# Patient Record
Sex: Female | Born: 1945
Health system: Southern US, Community
[De-identification: ages and names within clinical notes are randomized; demographics above are authoritative.]

## PROBLEM LIST (undated history)

## (undated) DIAGNOSIS — I10 Essential (primary) hypertension: Secondary | ICD-10-CM

## (undated) DIAGNOSIS — E039 Hypothyroidism, unspecified: Secondary | ICD-10-CM

## (undated) DIAGNOSIS — E785 Hyperlipidemia, unspecified: Secondary | ICD-10-CM

## (undated) HISTORY — PX: UPPER GI ENDOSCOPY: SHX6162

## (undated) HISTORY — PX: TONSILLECTOMY: SUR1361

## (undated) HISTORY — PX: CYSTOCELE REPAIR: SHX163

## (undated) HISTORY — PX: RECTOCELE REPAIR: SHX761

## (undated) HISTORY — PX: CHOLECYSTECTOMY: SHX55

## (undated) HISTORY — PX: COLONOSCOPY: SHX174

## (undated) HISTORY — PX: ABDOMINAL HYSTERECTOMY: SHX81

---

## 1998-09-17 ENCOUNTER — Ambulatory Visit (HOSPITAL_COMMUNITY): Admission: RE | Admit: 1998-09-17 | Discharge: 1998-09-17 | Payer: Self-pay | Admitting: Orthopedic Surgery

## 2001-01-05 ENCOUNTER — Inpatient Hospital Stay (HOSPITAL_COMMUNITY): Admission: EM | Admit: 2001-01-05 | Discharge: 2001-01-07 | Payer: Self-pay | Admitting: Emergency Medicine

## 2001-01-05 ENCOUNTER — Encounter: Payer: Self-pay | Admitting: Emergency Medicine

## 2002-05-23 ENCOUNTER — Other Ambulatory Visit: Admission: RE | Admit: 2002-05-23 | Discharge: 2002-05-23 | Payer: Self-pay | Admitting: Family Medicine

## 2004-09-02 ENCOUNTER — Ambulatory Visit: Payer: Self-pay | Admitting: Family Medicine

## 2006-10-20 ENCOUNTER — Other Ambulatory Visit: Admission: RE | Admit: 2006-10-20 | Discharge: 2006-10-20 | Payer: Self-pay | Admitting: Family Medicine

## 2008-08-16 ENCOUNTER — Inpatient Hospital Stay (HOSPITAL_COMMUNITY): Admission: EM | Admit: 2008-08-16 | Discharge: 2008-08-17 | Payer: Self-pay | Admitting: Emergency Medicine

## 2008-08-16 ENCOUNTER — Ambulatory Visit: Payer: Self-pay | Admitting: Internal Medicine

## 2008-08-19 ENCOUNTER — Ambulatory Visit: Payer: Self-pay

## 2008-08-29 ENCOUNTER — Ambulatory Visit: Payer: Self-pay | Admitting: Cardiology

## 2009-07-21 ENCOUNTER — Ambulatory Visit: Payer: Self-pay | Admitting: Vascular Surgery

## 2009-09-16 ENCOUNTER — Other Ambulatory Visit: Admission: RE | Admit: 2009-09-16 | Discharge: 2009-09-16 | Payer: Self-pay | Admitting: Family Medicine

## 2009-10-16 IMAGING — CR DG CHEST 1V PORT
1 series · 1 of 1 positions shown · non-contrast
Comparison: Report dated 01/05/2001.

CLINICAL DATA: Chest pain.

PORTABLE CHEST - 1 VIEW

[view not recorded]
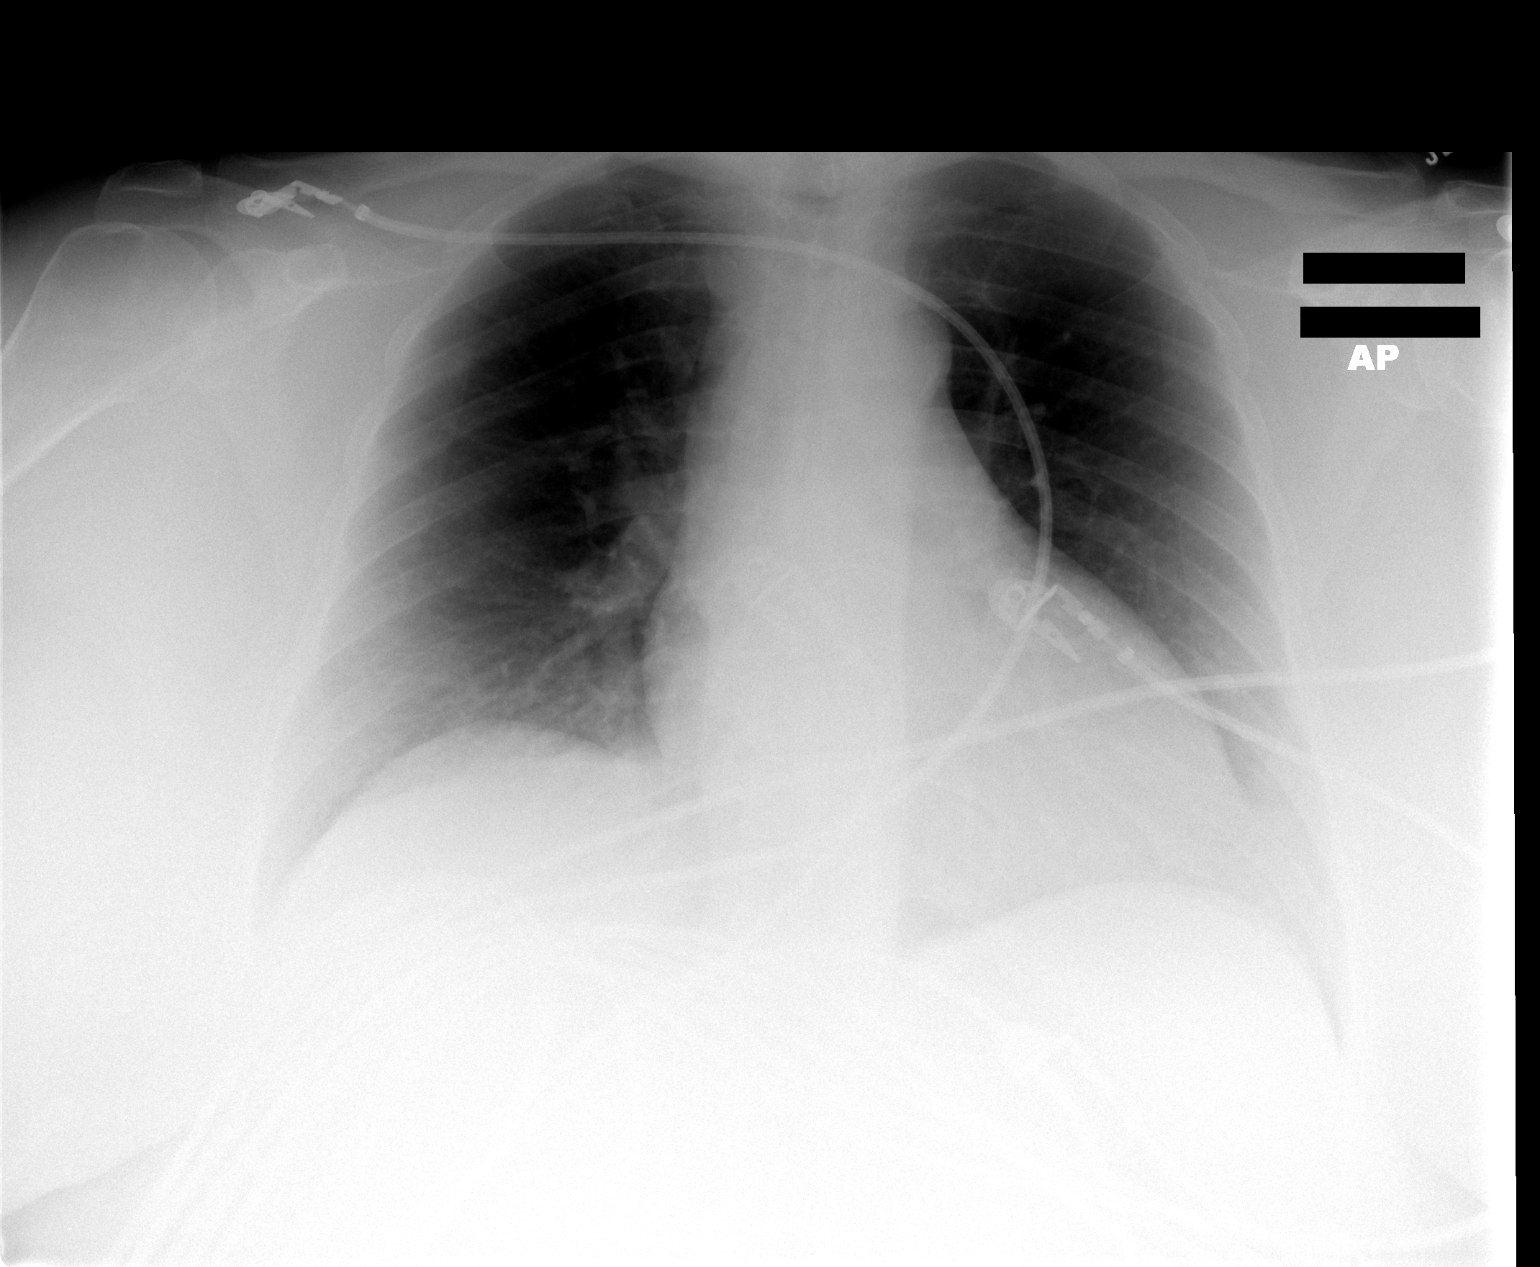

[1 of 1 positions shown; findings below may reference images not displayed]

FINDINGS: Borderline enlarged cardiac silhouette.  Clear lungs with
normal vascularity.  Unremarkable bones.  The previously described
density overlying the medial right clavicle is not currently
visualized.  The patient's chin is overlapping portions of both
clavicles.
IMPRESSION: Borderline cardiomegaly.  No acute abnormality.

## 2009-10-21 ENCOUNTER — Ambulatory Visit: Payer: Self-pay | Admitting: Vascular Surgery

## 2009-11-24 ENCOUNTER — Ambulatory Visit: Payer: Self-pay | Admitting: Vascular Surgery

## 2009-12-02 ENCOUNTER — Ambulatory Visit: Payer: Self-pay | Admitting: Vascular Surgery

## 2009-12-04 ENCOUNTER — Ambulatory Visit: Payer: Self-pay | Admitting: Vascular Surgery

## 2010-01-08 ENCOUNTER — Ambulatory Visit: Payer: Self-pay | Admitting: Vascular Surgery

## 2011-03-23 NOTE — H&P (Signed)
NAMELELER, BRION NO.:  [id number]   MEDICAL RECORD NO.:  [id number]          PATIENT TYPE:  INP   LOCATION:  2011                         FACILITY:  [facility]   PHYSICIAN:  Gordy Savers, MDDATE OF BIRTH:  12/13/1945   DATE OF ADMISSION:  08/16/2008  DATE OF DISCHARGE:                              HISTORY & PHYSICAL   CHIEF COMPLAINT:  Chest pain.   HISTORY OF PRESENT ILLNESS:  The patient is a 65 year old female who was  admitted to the hospital in February 2002, for evaluation of chest pain  at that time and acute myocardial infarction was excluded and the  patient had a followup outpatient Cardiolite stress test that was  negative.  For the past 6 months, she has had frequent back and chest  pain.  This has been intermittent over the past 6 months and usually  precipitated by exertion and quickly relieved by rest within 5 minutes.  The character of the pain was unchanged until the past 3 days.  Over the  past 3 days, she describes a more severe squeezing interscapular pain  that radiates to the anterior chest.  This has become much more severe  and the patient scores this 7/10 grade of pain.  Pain usually is  aggravated by activities but now occasionally occurs at rest.  Pain at  times last hours before relief associated symptoms include shortness of  breath, diaphoresis, mild dizziness, and nausea.  He also describes some  tingling involving both arms, the left greater than the right.  Due to  the increasing frequency and severity of pain, the patient was seen by  her primary care Lanita Stammen on the day of admission.  In the office  setting, the patient had relief of her chest discomfort promptly with  nitroglycerin.  The patient was referred to the ED for evaluation where  EKG revealed a normal sinus rhythm and no acute changes.  Two sets of  cardiac enzymes have been negative.  The patient is now admitted for  further evaluation and treatment of her  chest pain syndrome.   PAST MEDICAL HISTORY:  The patient has a long history of hypertension  and hypothyroidism.  Additionally, she has obesity and a history of  gastroesophageal flexed disease.  She has DJD.  As mentioned, she was  last admitted to hospital to exclude an acute MI in February 2002.   Her present medical regimen includes:  1. Aspirin 162 mg daily.  2. Atenolol 50 mg daily.  3. Quinapril 40 mg daily.  4. Hydrochlorothiazide 25 mg daily.  5. Synthroid 0.05 mg daily.  6. Fish oil supplements.   Prior procedures have included a hysterectomy and probably an incidental  appendectomy.  She is a gravida 3, para 3, abortus 0 with a prior C-  section.   FAMILY HISTORY:  The patient's mother died of an acute MI at age 43.  Father died with complications of lung cancer.  Two brothers, two  sisters, one brother has a history of diabetes and congestive heart  failure.  Another brother with Crohn disease.  One sister died of a CNS  neoplasm at age 85, and one sister with chronic kidney disease.   SOCIAL HISTORY:  Married.  Nonsmoker since age 27.   Examination revealed a well-developed obese, white female, pleasant,  alert in no distress.  Nasal oxygen in place.  Head and neck revealed  normal pupillary responses.  Conjunctiva clear.  ENT unremarkable.  Neck, no adenopathy, bruits, or neck vein distention.  Chest was clear.  Cardiovascular exam normal S1 and S2.  No murmurs or gallops.  No chest  wall tenderness.  Abdomen is obese, soft, and nontender.  No  organomegaly.  Extremities are negative.  Peripheral pulses intact.   IMPRESSION:  Chest pain syndrome, rule out acute coronary insufficiency.   ADDITIONAL DIAGNOSES:  1. Hypertension.  2. Obesity.  3. Hypothyroidism.  4. History of gastroesophageal reflux disease.   DISPOSITION:  The patient will be admitted to a telemetry setting, rule  out the MI.  Protocol will be instituted.  The patient will have another  set  of cardiac enzymes and a followup EKG in the morning.  She will be  treated with beta-blockers and maintained on her preadmission ACE  inhibitor.  Aspirin will be continued and Plavix added to her regimen.  She will be placed on Lovenox per pharmacy protocol.  The patient will  have a Cardiology consultation in the morning.      Gordy Savers, MD  Electronically Signed     PFK/MEDQ  D:  08/16/2008  T:  08/17/2008  Job:  [id number]

## 2011-03-23 NOTE — Consult Note (Signed)
NAMEDOMINO, HOLTEN NO.:  [id number]   MEDICAL RECORD NO.:  [id number]          PATIENT TYPE:  INP   LOCATION:  2011                         FACILITY:  [facility]   PHYSICIAN:  Hillis Range, MD       DATE OF BIRTH:  Nov 06, 1946   DATE OF CONSULTATION:  DATE OF DISCHARGE:  08/17/2008                                 CONSULTATION   CONSULTING PHYSICIAN:  Kela Millin, MD.   REASON FOR CONSULTATION:  Chest pain.   INTRODUCTION:  Ms. Kamm is a pleasant 65 year old female with a history  of hypertension, hyperlipidemia, and obesity who presents for further  evaluation and management of chest discomfort.  The patient reports that  over the past 6-8 months, she has had a squeezing and burning  sensation within in her back, between her shoulder blades, which often  radiates into in front of her chest.  These episodes typically occur  several times per month at rest, lasting 4-5 minutes.  She reports that  over the past week, she has had increasing frequency and duration of  these episodes.  Her pain is slightly different over the past couple of  days and has radiated as well into her left lower breast.  She reports  occasional associated diaphoresis as well as a tingling sensation within  the tips of her fingers on both hands.  The patient reports chronic and  mild dyspnea with ambulation more than 1 flight of steps or with  moderate activities.  She denies recent nausea, vomiting, palpitations,  near syncope, or syncope.  The patient reports long-standing  difficulties with gastroesophageal reflux.  She has previously tried H2  blockers; however, she has had stopped these due to diarrhea.  She has  also tried Prilosec and Nexium, but was unable to tolerate these  medications.  She reports that her chest discomfort typically occurs at  rest, but does occasionally occur with activities.  She has not noticed  an association with foods.  She presented to the emergency  department  last evening and reports that her chest pain gradually resolved  overnight.  She has had no further chest discomfort this morning.  Presently, she is ambulatory and without complaint.   PAST MEDICAL HISTORY:  1. Hypertension.  2. Morbid obesity.  3. Hypothyroidism.  4. Gastroesophageal reflux disease.  5. Degenerative joint disease.  6. Hyperlipidemia.  7. Status post cardiac Myoview in 2002 by Dr. Myrtis Ser, which was normal.  8. Status post cholecystectomy in 1987.  9. Status post total abdominal hysterectomy.   MEDICATIONS AT HOME:  1. Aspirin 162 mg daily.  2. Atenolol 50 mg daily.  3. Quinapril 40 mg daily.  4. Hydrochlorothiazide 25 mg daily.  5. Synthroid 50 mcg daily.  6. Fish oil b.i.d.   ALLERGIES:  PHENOBARBITAL causes rash.  The patient reports intolerance  to H2 BLOCKERS as noted above.   SOCIAL HISTORY:  The patient lives in [place] with her spouse.  She  is retired.  She denies tobacco, alcohol, or drug use.   FAMILY HISTORY:  The patient's mother had  coronary disease in her 24s.  She also has a family history of diabetes and congestive heart failure.   REVIEW OF SYSTEMS:  All systems are reviewed and negative except as  outlined in the HPI above.   PHYSICAL EXAMINATION:  VITAL SIGNS:  Blood pressure 129/59, heart rate  56, respirations 18, and sats 95% on room air.  GENERAL:  The patient is an obese female in no acute distress.  She is  alert and oriented x3.  HEENT:  Normocephalic and atraumatic.  Sclerae clear.  Conjunctivae  pink.  Oropharynx clear.  NECK:  Supple.  No JVD, lymphadenopathy, or bruits.  LUNGS:  Clear to auscultation bilaterally.  HEART:  Regular rate and rhythm.  No murmurs, rubs, or gallops.  GI:  Soft, nontender, and nondistended.  Positive bowel sounds.  EXTREMITIES:  No clubbing, cyanosis, or edema.  No Homans or cords.  SKIN:  No ecchymosis or laceration.  MUSCULOSKELETAL:  No deformity or atrophy.  PSYCHIATRIC:   Euthymic mood.  Full affect.  NEUROLOGIC:  Strength and sensation are intact.   LABORATORY DATA:  Total cholesterol 201, LDL 143, and HDL 23.  Hematocrit 39 and white blood cell count 8.  Troponin, CK, and CK-MB are  all negative.   Chest x-ray, no acute airspace disease.   EKG, sinus bradycardia, no ischemic ST/T-wave changes, otherwise normal.   IMPRESSION:  Ms. Budreau is a very pleasant 65 year old female with a  history of hypertension, hyperlipidemia, and obesity who now presents  with chest discomfort.  The patient's chest pain has been present  intermittently for more than 6 months and appears to be atypical.  She  has noticed some increase in her pain over the past few days, now it has  presently resolved.  Her EKG and cardiac markers are negative.  The  patient likely has chest discomfort related to chronic untreated  gastroesophageal reflux disease.  She does have risk factors for  coronary artery disease as described above.   PLAN:  I think that the most prudent plan would be to obtain a GXT  Myoview to further evaluate for ischemia.  This could be safely  performed as an outpatient in the next few days.  I would recommend that  the patient be discharged on her home medication regimen with addition  of sublingual nitroglycerin.  I have offered simvastatin 20 mg daily for  hyperlipidemia; however, the patient presently wishes to defer medical  therapy and continue to work on weight loss and further lifestyle  modifications.  She is aware that should she have any further chest  discomfort,  increasing shortness of breath, or other concerns that she should return  to the hospital for further inpatient evaluation.  She should also  follow up with Dr. Willa Rough as an outpatient for further cardiac  risk stratification after her Myoview has been completed.      Hillis Range, MD  Electronically Signed     JA/MEDQ  D:  08/17/2008  T:  08/17/2008  Job:  [id number]   cc:    Holley Bouche, M.D.  Luis Abed, MD, [facility]

## 2011-03-23 NOTE — Assessment & Plan Note (Signed)
OFFICE VISIT   ELLENI, MOZINGO  DOB:  [birth year]                                       10/21/2009  XBJYN#:[id number]   This patient returns for follow-up, having treated her venous  insufficiency with elastic compression stockings (long leg, 20 mm - 30  mm) as well as ibuprofen and elevation.  She has done this for the past  3 months with no improvement in her symptomatology.  She continues to  have aching, throbbing, burning discomfort in the left thigh and calf  which is in the same location as the bulging varicosities, secondary to  her reflux in the left great saphenous system.  She also has some reflux  in her deep system.   On examination, she continues to have these bulging varicosities with 1+  edema distally in a pattern of reticular and spider veins in the mid  lower leg medially over the great saphenous system as well.   Blood pressure 148/75, heart rate 66, respirations 14.   This lady does have gross reflux due to valvular incompetence in the  left great saphenous system.  I think we should proceed with laser  ablation of the left great saphenous vein with multiple stab  phlebectomies to be followed by 1 course of sclerotherapy to treat her  severe venous insufficiency in the left leg.  We will proceed to perform  that in the near future to relieve her symptomatology.   Quita Skye Hart [place], M.D.  Electronically Signed   JDL/MEDQ  D:  10/21/2009  T:  10/22/2009  Job:  [id number]

## 2011-03-23 NOTE — Assessment & Plan Note (Signed)
OFFICE VISIT   IVIE, SAVITT  DOB:  Aug 14, 1946                                       12/02/2009  JXBJY#:[id number]   The patient returns 1 week post-laser ablation of the left greater  saphenous vein with multiple stab phlebectomies for painful varicosities  in the left greater saphenous system.  She has had some mild discomfort  along the course of greater saphenous vein in the groin and mid-thigh  area as one would expect.  She has had some bruising distally in the  thigh.  She has had no tenderness associated with stab phlebectomies and  no distal edema.  On physical exam, she has no evidence of any residual  bulging varicosities.  She does have extensive reticular veins.  There  is no distal edema.  There is mild tenderness along the course of the  greater saphenous vein.  Venous duplex exam today reveals no evidence of  deep venous obstruction in the left leg and total ablation of the left  greater saphenous vein from the saphenofemoral junction to the knee.  I  reassured her regarding these findings and she will have sclerotherapy  performed in the near future.     Quita Skye Hart [place], M.D.  Electronically Signed   JDL/MEDQ  D:  12/02/2009  T:  12/03/2009  Job:  [id number]

## 2011-03-23 NOTE — Consult Note (Signed)
NEW PATIENT CONSULTATION   Tammie Hammond, Tammie Hammond  DOB:  1946-01-09                                       07/21/2009  JXBJY#:[id number]   The patient is a 65 year old female referred by Dr. Holley Bouche for  painful varicosities.  This patient has had varicose veins in both lower  extremities for the last 20-25 years and they have become increasingly  symptomatic particularly in the left leg over the last year or two.  She  now experiences a heavy aching throbbing discomfort in the left calf and  thigh, which worsens the more she is on her feet.  This sometimes will  also bother her at night.  She has swelling in the left ankle but no  history of deep vein thrombosis, thrombophlebitis, bleeding, stasis  ulcers, or darkening of the skin.  She has not worn elastic compression  stockings but does state that elevation of her legs helps somewhat.  She  takes occasional ibuprofen, which does not relieve her pain.  She does  state that the symptoms are affecting her daily life with inability to  relieve the aching and throbbing discomfort as long as she is on her  feet.   PAST MEDICAL HISTORY:  1. Hypertension.  2. History of a torn left medial meniscus.  3. Negative for diabetes, hyperlipidemia, coronary artery disease,      COPD, or stroke.   PREVIOUS SURGERY:  1. Cholecystectomy.  2. Hysterectomy.  3. Cesarean section.   FAMILY HISTORY:  Positive for diabetes, coronary artery disease in her  mother.  Negative for stroke.   SOCIAL HISTORY:  She is married and has 3 children, is retired.  She  quit smoking 30 years ago.  Does not use alcohol.   REVIEW OF SYSTEMS:  Positive for dyspnea on exertion, but no chest pain.  No weight loss or anorexia.  Denies any pulmonary symptoms.  Does have  reflux esophagitis, discomfort in her lower extremities with walking,  but limited more by dyspnea.  Diffuse joint pain.  Otherwise, all  systems negative.   ALLERGIES:   Phenobarbital.   MEDICATIONS:  Please see health history exam.   PHYSICAL EXAM:  Blood pressure 166/90, heart rate 64, respirations 14.  Generally, she is an obese female who is in no apparent distress.  She  is alert and oriented x3.  Neck is supple.  3+ carotid pulses palpable.  No bruits are audible.  Neurologic exam is normal.  No palpable  adenopathy of the neck.  Chest clear to auscultation.  Cardiovascular  exam is a regular rhythm with no murmurs.  Abdomen is soft and nontender  with no masses.  She has 3+ femoral, popliteal, and dorsalis pedis  pulses bilaterally.  There is mild edema in the left ankle at 1+.  None  on the right.  The left leg has varicosities in the great saphenous  system beginning in the medial thigh and extending down into the medial  calf with also a large patch of reticular and spider veins medially.  No  ulcers are noted.  The right leg has extensive spider and reticular  veins in the lateral aspect of the calf with a few varicosities in the  lateral thigh.   Venous duplex exam today reveals a left greater saphenous vein to have  diffuse reflux from the knee to  the saphenofemoral junction with mild  reflux in the deep system bilaterally.  Right leg has a segment of vein  laterally in the thigh, which has reflux but does not communicate with  the saphenofemoral junction.  Both small saphenous veins are normal.   I think she does have painful varicosities in the left leg caused by  great saphenous reflux, which are affecting her daily living.  Will  treat her with long-leg elastic compression stockings (20 mm to 30 mm  gradient), as well as elevation as much as possible, and ibuprofen  daily.  Will see her back in 3 months and if she has had no relief in  her symptoms, I would recommend laser ablation of the left greater  saphenous vein with multiple stab phlebectomies to be followed by 1  course of sclerotherapy in the left leg.   Quita Skye Hart [place],  M.D.  Electronically Signed   JDL/MEDQ  D:  07/21/2009  T:  07/21/2009  Job:  [id number]   cc:   Holley Bouche, M.D.

## 2011-03-23 NOTE — Procedures (Signed)
LOWER EXTREMITY VENOUS REFLUX EXAM   INDICATION:  Bilateral varicose veins with minimal bilateral pain and  left lower extremity swelling.   EXAM:  Using color-flow imaging and pulse Doppler spectral analysis, the  right and left common femoral, superficial femoral, popliteal, posterior  tibial, greater and lesser saphenous veins are evaluated.  There is  evidence suggesting deep venous insufficiency in the right and left  lower extremity.   The left saphenofemoral junction is not competent.  The right is within  normal limits.  The left GSV is not competent with the caliber as  described below.  The right is within normal limits.   The right and left proximal short saphenous veins demonstrates  competency.   GSV Diameter (used if found to be incompetent only)                                            Right    Left  Proximal Greater Saphenous Vein           cm       0.53 cm  Proximal-to-mid-thigh                     cm       cm  Mid thigh                                 cm       0.43 cm  Mid-distal thigh                          cm       cm  Distal thigh                              cm       0.48 cm  Knee                                      cm       0.44 cm   IMPRESSION:  1. Left greater saphenous vein reflux is identified with the caliber      ranging from 0.44 cm to 0.53 cm knee to groin.  The right is within      normal limits.  2. The right and left greater saphenous veins are not aneurysmal.  3. The right and left greater saphenous veins are not tortuous.  4. The deep venous system is not competent bilaterally.  5. The right and left lesser saphenous veins are competent.   ___________________________________________  Quita Skye. Hart [place], M.D.   AS/MEDQ  D:  07/21/2009  T:  07/22/2009  Job:  [id number]

## 2011-03-23 NOTE — Procedures (Signed)
DUPLEX DEEP VENOUS EXAM - LOWER EXTREMITY   INDICATION:  Follow up left great saphenous vein ablation.   HISTORY:  Edema:  No.  Trauma/Surgery:  Left great saphenous vein ablation 11/24/09.  Pain:  Left lower extremity.  PE:  No.  Previous DVT:  No.  Anticoagulants:  No.  Other:   DUPLEX EXAM:                CFV   SFV   PopV   PTV   GSV                R  L  R  L  R   L  R  L  R  L  Thrombosis    o  o     o      o     o     +  Spontaneous   +  +     +      +     +     o  Phasic        +  +     +      +     +     o  Augmentation  +  +     +      +     +     o  Compressible  +  +     +      +     +     o  Competent     D  D     D      D     +     o   Legend:  + - yes  o - no  p - partial  D - decreased   IMPRESSION:  1. No evidence of deep venous thrombosis in the left lower extremity      or right common femoral vein.  2. Evidence of ablation of left great saphenous vein without flow from      knee to saphenofemoral junction; however, does not extend in the      common femoral vein.    _____________________________  Quita Skye. Hart [place], M.D.   AS/MEDQ  D:  12/02/2009  T:  12/02/2009  Job:  [id number]

## 2011-03-23 NOTE — Assessment & Plan Note (Signed)
[facility]                            CARDIOLOGY OFFICE NOTE   NAME:Sprunger, Tammie Hammond                        MRN:          [id number]  DATE:08/29/2008                            DOB:          February 07, 1946    Tammie Hammond had been admitted to [facility]  We saw her in consultation  on August 17, 2008.  In our evaluation the patient had some chest  discomfort, and there was no evidence of an MI.  It was felt that the  remainder of her workup could be done as an outpatient.  Ultimately she  was discharged home.  I do not have a copy of discharge summary  available to me at this time.  The patient underwent a stress Myoview  scan on August 19, 2008.  She exercised for 7 minutes.  She had  fatigue.  She did have a hypertensive blood pressure response.  There  was no diagnostic ST change.  There was no sign of scar or ischemia, and  the ejection fraction was normal in the 70% range.   Since being out of the hospital she has been stable.  She has not had  any significant recurring symptoms.   She is, therefore, here for follow-up today to hear the results of this  study.  I have reviewed the report this morning.  She also has a history  of hypertension, hyperlipidemia, and obesity.  These issues are stable  as of today, but need further treatment through Dr. Tiburcio Pea.   PAST MEDICAL HISTORY:   ALLERGIES:  PHENOBARBITAL.   MEDICATIONS:  Quinapril 40, hydrochlorothiazide 25, Synthroid 0.05,  atenolol 50, aspirin and fish oil.   OTHER MEDICAL PROBLEMS:  See the list below.   REVIEW OF SYSTEMS:  She is not having any GI or GU symptoms.  She has no  fever or rash.  Otherwise review of systems is negative.   PHYSICAL EXAM:  VITAL SIGNS:  Blood pressure is 126/84 with a pulse of  65.  Her weight is 234 pounds.  The patient is here with her husband.  She is oriented to person, time and place.  Affect is normal.  HEENT:  No xanthelasma.  She has normal extraocular  motion.  There are  no carotid bruits.  There is no jugular venous distention.  LUNGS:  Clear.  Respiratory effort is not labored.  CARDIAC:  Exam reveals an S1 and S2.  There are no clicks or significant  murmurs.  ABDOMEN:  Soft.  She has no peripheral edema.   I reviewed the report of her Myoview scan.  She had no definite sign of  ischemia, and she has a normal ejection fraction.   Problems include:  1. Hypertension, stable at this time, although she does have a      hypertensive response to stress.  Her blood pressure meds were held      for this study. It is important that she stay on her medicines and      have ongoing follow-up of her blood pressure.  2. Hyperlipidemia, being treated.  3.  Obesity.  Clearly weight loss will help her.  4. A recent hospitalization with some shortness of breath and some      chest discomfort.  At this point it does not appear to be a cardiac      abnormality.   The patient is stable.  She is scheduled to see Dr. Tiburcio Pea today.  I  will be happy to follow up and help over time with her care.     Luis Abed, MD, [facility]  Electronically Signed    JDK/MedQ  DD: 08/29/2008  DT: 08/29/2008  Job #: [id number]   cc:   Holley Bouche, M.D.

## 2011-03-26 NOTE — Discharge Summary (Signed)
[place]  Patient:    Tammie Hammond, Tammie Hammond                        MRN: [id number] Adm. Date:  [id number] Disc. Date: [id number] Attending:  Talitha Givens Dictator:   Abelino Derrick, P.A.C. [facility]                  Referring Physician Discharge [place]  DISCHARGE DIAGNOSES: 1. Chest pain, myocardial infarction ruled out. 2. Treated hypertension. 3. Treated hypothyroidism. 4. History of arthritis. 5. Gastroesophageal reflux symptoms.  HISTORY OF PRESENT ILLNESS:  The patient is a 65 year old female admitted on January 05, 2001, with chest pain associated with some nausea.  EKG showed sinus rhythm without acute changes.  Luis Abed, M.D., felt that her symptoms were somewhat atypical for coronary artery disease.  She was admitted to telemetry to rule out MI.  HOSPITAL COURSE:  The plan initially was to send her home when the first of her enzymes were negative, but she continued to have some chest tightness. She was kept another 24 hours and ambulated and further enzymes were obtained. These were negative.  The patient was pain-free on the morning of January 07, 2001.  She will be discharged and set up for an outpatient stress only exercise Cardiolite study.  If this normal, then she will be referred back to Evette Georges, M.D., for possible GI evaluation.  DISCHARGE MEDICATIONS: 1. Accupril 40 mg a day. 2. Hydrochlorothiazide 25 mg a day. 3. Prilosec 20 mg a day. 4. Synthroid 0.05 mg a day. 5. Premarin 0.625 mg q.d.  LABORATORY DATA:  CK-MB and troponins were negative.  Sodium 136, potassium 3.4, BUN 13, creatinine 0.7.  White count 9.2, hemoglobin 12.6, hematocrit 37.1, platelets 189.  The lipase is 8 and amylase 27.  TSH 1.51. The lipid profile shows a total cholesterol of 194, HDL 37, and LDL 124.  The chest x-ray shows no acute disease.  The EKG shows sinus rhythm without acute changes.  DISPOSITION:  The patient is discharged in  stable condition and will be set up for an outpatient stress test. DD:  01/07/01 TD:  01/07/01 Job: [id number]

## 2011-08-10 LAB — POCT I-STAT, CHEM 8
BUN: 14
Calcium, Ion: 1.14
Chloride: 108
Creatinine, Ser: 0.9
Glucose, Bld: 91
HCT: 40
Hemoglobin: 13.6
Potassium: 3.6
Sodium: 140
TCO2: 24

## 2011-08-10 LAB — LIPID PANEL
HDL: 23 — ABNORMAL LOW
Total CHOL/HDL Ratio: 8.7
VLDL: 35

## 2011-08-10 LAB — POCT CARDIAC MARKERS
CKMB, poc: 2.2
CKMB, poc: <1 — ABNORMAL LOW
Myoglobin, poc: 54.3
Myoglobin, poc: 59.9
Troponin i, poc: <0.05
Troponin i, poc: <0.05

## 2011-08-10 LAB — DIFFERENTIAL
Basophils Absolute: 0
Eosinophils Absolute: 0.3
Eosinophils Relative: 4
Monocytes Absolute: 0.6

## 2011-08-10 LAB — CBC
HCT: 39.7
Hemoglobin: 13.2
MCV: 91.3
MCV: 93
Platelets: 150
RBC: 4.36
WBC: 8.1
WBC: 8.8

## 2011-08-10 LAB — COMPREHENSIVE METABOLIC PANEL
ALT: 35
AST: 32
Albumin: 3.5
Alkaline Phosphatase: 48
CO2: 26
Chloride: 107
GFR calc Af Amer: >60
GFR calc non Af Amer: >60
Potassium: 3.6
Sodium: 141
Total Bilirubin: 0.6

## 2011-08-10 LAB — MAGNESIUM: Magnesium: 1.9

## 2011-11-15 DIAGNOSIS — Z13820 Encounter for screening for osteoporosis: Secondary | ICD-10-CM | POA: Diagnosis not present

## 2011-11-15 DIAGNOSIS — Z1231 Encounter for screening mammogram for malignant neoplasm of breast: Secondary | ICD-10-CM | POA: Diagnosis not present

## 2011-11-30 DIAGNOSIS — I10 Essential (primary) hypertension: Secondary | ICD-10-CM | POA: Diagnosis not present

## 2011-11-30 DIAGNOSIS — E785 Hyperlipidemia, unspecified: Secondary | ICD-10-CM | POA: Diagnosis not present

## 2011-11-30 DIAGNOSIS — M25519 Pain in unspecified shoulder: Secondary | ICD-10-CM | POA: Diagnosis not present

## 2011-11-30 DIAGNOSIS — R7309 Other abnormal glucose: Secondary | ICD-10-CM | POA: Diagnosis not present

## 2011-11-30 DIAGNOSIS — E039 Hypothyroidism, unspecified: Secondary | ICD-10-CM | POA: Diagnosis not present

## 2011-12-07 DIAGNOSIS — L259 Unspecified contact dermatitis, unspecified cause: Secondary | ICD-10-CM | POA: Diagnosis not present

## 2012-03-09 DIAGNOSIS — E7800 Pure hypercholesterolemia, unspecified: Secondary | ICD-10-CM | POA: Diagnosis not present

## 2012-03-09 DIAGNOSIS — I10 Essential (primary) hypertension: Secondary | ICD-10-CM | POA: Diagnosis not present

## 2012-03-22 DIAGNOSIS — H25099 Other age-related incipient cataract, unspecified eye: Secondary | ICD-10-CM | POA: Diagnosis not present

## 2012-08-14 DIAGNOSIS — Z23 Encounter for immunization: Secondary | ICD-10-CM | POA: Diagnosis not present

## 2012-09-22 DIAGNOSIS — D696 Thrombocytopenia, unspecified: Secondary | ICD-10-CM | POA: Diagnosis not present

## 2012-09-22 DIAGNOSIS — R7309 Other abnormal glucose: Secondary | ICD-10-CM | POA: Diagnosis not present

## 2012-09-22 DIAGNOSIS — E785 Hyperlipidemia, unspecified: Secondary | ICD-10-CM | POA: Diagnosis not present

## 2012-09-22 DIAGNOSIS — R220 Localized swelling, mass and lump, head: Secondary | ICD-10-CM | POA: Diagnosis not present

## 2012-09-22 DIAGNOSIS — E039 Hypothyroidism, unspecified: Secondary | ICD-10-CM | POA: Diagnosis not present

## 2012-09-22 DIAGNOSIS — I10 Essential (primary) hypertension: Secondary | ICD-10-CM | POA: Diagnosis not present

## 2012-09-22 DIAGNOSIS — B356 Tinea cruris: Secondary | ICD-10-CM | POA: Diagnosis not present

## 2012-09-22 DIAGNOSIS — R221 Localized swelling, mass and lump, neck: Secondary | ICD-10-CM | POA: Diagnosis not present

## 2012-10-10 DIAGNOSIS — R220 Localized swelling, mass and lump, head: Secondary | ICD-10-CM | POA: Diagnosis not present

## 2012-10-28 DIAGNOSIS — J209 Acute bronchitis, unspecified: Secondary | ICD-10-CM | POA: Diagnosis not present

## 2012-10-28 DIAGNOSIS — J019 Acute sinusitis, unspecified: Secondary | ICD-10-CM | POA: Diagnosis not present

## 2012-11-15 ENCOUNTER — Encounter (HOSPITAL_BASED_OUTPATIENT_CLINIC_OR_DEPARTMENT_OTHER): Payer: Self-pay | Admitting: *Deleted

## 2012-11-15 NOTE — Progress Notes (Signed)
11/15/12 1635  OBSTRUCTIVE SLEEP APNEA  Have you ever been diagnosed with sleep apnea through a sleep study? No  Do you snore loudly (loud enough to be heard through closed doors)?  1  Do you often feel tired, fatigued, or sleepy during the daytime? 0  Has anyone observed you stop breathing during your sleep? 0  Do you have, or are you being treated for high blood pressure? 1  BMI more than 35 kg/m2? 1  Age over 69 years old? 1  Gender: 0  Obstructive Sleep Apnea Score 4   Score 4 or greater  Results sent to PCP

## 2012-11-15 NOTE — Progress Notes (Signed)
To come in for bmet-ekg Had cardiac work up 09-all negative Denies osa To bring all meds and overnight bag

## 2012-11-16 ENCOUNTER — Encounter (HOSPITAL_BASED_OUTPATIENT_CLINIC_OR_DEPARTMENT_OTHER)
Admission: RE | Admit: 2012-11-16 | Discharge: 2012-11-16 | Disposition: A | Discharge status: Home / Self Care | Payer: Medicare Other | Source: Ambulatory Visit | Attending: Otolaryngology | Admitting: Otolaryngology

## 2012-11-16 ENCOUNTER — Other Ambulatory Visit: Payer: Self-pay

## 2012-11-16 DIAGNOSIS — E7800 Pure hypercholesterolemia, unspecified: Secondary | ICD-10-CM | POA: Diagnosis not present

## 2012-11-16 DIAGNOSIS — I889 Nonspecific lymphadenitis, unspecified: Secondary | ICD-10-CM | POA: Diagnosis not present

## 2012-11-16 DIAGNOSIS — E039 Hypothyroidism, unspecified: Secondary | ICD-10-CM | POA: Diagnosis not present

## 2012-11-16 DIAGNOSIS — I10 Essential (primary) hypertension: Secondary | ICD-10-CM | POA: Diagnosis not present

## 2012-11-16 DIAGNOSIS — Z90710 Acquired absence of both cervix and uterus: Secondary | ICD-10-CM | POA: Diagnosis not present

## 2012-11-16 DIAGNOSIS — Z87891 Personal history of nicotine dependence: Secondary | ICD-10-CM | POA: Diagnosis not present

## 2012-11-16 DIAGNOSIS — Z8249 Family history of ischemic heart disease and other diseases of the circulatory system: Secondary | ICD-10-CM | POA: Diagnosis not present

## 2012-11-16 LAB — BASIC METABOLIC PANEL
CO2: 25 mEq/L (ref 19–32)
Calcium: 10.1 mg/dL (ref 8.4–10.5)
Chloride: 102 mEq/L (ref 96–112)
Creatinine, Ser: 0.64 mg/dL (ref 0.50–1.10)
Glucose, Bld: 90 mg/dL (ref 70–99)

## 2012-11-17 NOTE — H&P (Signed)
Assessment   Parotid mass (784.2) (R22.0). Discussed  Patient has a mass in the tail of the right parotid gland. It is most likely benign but this can only be confirmed with excision of gland. Recommend surgical removal. Risks and benefits of surgery discussed. All questions and concerns addressed. Consent obtained. Patient scheduled at the [facility] in about five weeks. She will follow up accordingly with ENT post-operatively. Plan  I have interviewed and examined the patient and developed the proposed treatment plan.  Analissa Bayless H. Pollyann Kennedy, M.D. Reason For Visit  Tammie Hammond is here today at the kind request of Holley Bouche for consultation and opinion for knot behind right ear. HPI  Patient is a 67 year old female who presents for right neck node. Present for about six weeks, she says it has graually tripled in size. Is not painful, just feels "swollen."  She has not noticed any skin changes of redness or significant pain. She has been treated with Meloxicam, no antibiotic therapy. She has not noticed other enlarged nodes elsewhere on the body. No recent trauma to the skin of the scalp. No fever, chills or unintentional weight loss. Denies facial weakness, otalgia, sore throat, dysphagia or odynophagia. No PMH of cancer, OSA or CVD. No history of tobacco use. Allergies  PHENobarbital TABS. Current Meds  Hydrochlorothiazide 25 MG Oral Tablet;; RPT Quinapril HCl - 40 MG Oral Tablet;; RPT Levothyroxine Sodium 50 MCG Oral Tablet;; RPT Simvastatin 20 MG Oral Tablet;; RPT Aspirin EC 81 MG Oral Tablet Delayed Release;; RPT Calcium + D TABS;; RPT. Active Problems  Hypercholesteremia  (272.0) (E78.0) Hypertension  (401.9) (I10) Hypothyroidism  (244.9) (E03.9). PSH  Cesarean Section Hysterectomy Tonsillectomy. Family Hx  Family history of cardiac disorder (V17.49) (Z82.49) Family history of hearing loss (V19.2) (Z82.2) Family history of hypertension: Mother (V17.49) (Z83.49). Personal Hx    Caffeine use; 2 cups daily Former smoker [phone]) [phone]); 1970 quit No alcohol use. ROS  Systemic: Not feeling tired (fatigue).  No fever, no night sweats, and no recent weight loss. Head: No headache. Eyes: No eye symptoms. Otolaryngeal: No hearing loss, no earache, no tinnitus, and no purulent nasal discharge.  No nasal passage blockage (stuffiness).  Snoring.  No sneezing, no hoarseness, and no sore throat. Cardiovascular: No chest pain or discomfort  and no palpitations. Pulmonary: No dyspnea, no cough, and no wheezing. Gastrointestinal: No dysphagia  and no heartburn.  No nausea, no abdominal pain, and no melena.  No diarrhea. Genitourinary: No dysuria. Endocrine: No muscle weakness. Musculoskeletal: No calf muscle cramps, no arthralgias, and no soft tissue swelling. Neurological: No dizziness, no fainting, no tingling, and no numbness. Psychological: No anxiety  and no depression. Skin: No rash. 12 system ROS was obtained and reviewed on the Health Maintenance form dated today.  Positive responses are shown above.  If the symptom is not checked, the patient has denied it. Vital Signs   Recorded by [facility] on 10 Oct 2012 01:08 PM BP:160/90,  Height: 63.5 in, Weight: 223 lb, BMI: 38.9 kg/m2,  BMI Calculated: 38.88 ,  BSA Calculated: 2.04. Physical Exam  APPEARANCE: Well developed, well nourished, in no acute distress.  Normal affect, in a pleasant mood.  Oriented to time, place and person. COMMUNICATION: Normal voice   HEAD & FACE:  No scars, lesions or masses of head and face.  Sinuses nontender to palpation.  Salivary glands without mass or tenderness.  Facial strength symmetric.  No facial lesion, scars, or mass. EYES: EOMI with normal primary gaze  alignment. Visual acuity grossly intact.  PERRLA EXTERNAL EAR & NOSE: No scars, lesions or masses  EAC & TYMPANIC MEMBRANE:  EAC shows no obstructing lesions or debris and tympanic membranes are normal bilaterally with  good movement to insufflation. GROSS HEARING: Normal Weber  TMJ:  Nontender  INTRANASAL EXAM: No polyps or purulence.  NASOPHARYNX: Normal, without lesions. LIPS, TEETH & GUMS: No lip lesions, normal dentition and normal gums. ORAL CAVITY/OROPHARYNX:  Oral mucosa moist without lesion or asymmetry of the palate, tongue, tonsil or posterior pharynx. NECK:  Supple without adenopathy or mass, except for a 2 cm rubbery mass involving the tail of the right parotid. THYROID:  Normal with no masses palpable.  NEUROLOGIC:  No gross CN deficits. No nystagmus noted.    LARYNX (mirror exam):  No lesions of the epiglottis, false cord or TVC's and cords move well to phonation. Mild postcricoid erythema and edema. Signature  Electronically signed by : Aquilla Hacker  PA-C; 10/10/2012 2:07 PM EST. Electronically signed by : Serena Colonel  M.D.; 10/10/2012 2:13 PM EST.

## 2012-11-20 ENCOUNTER — Encounter (HOSPITAL_BASED_OUTPATIENT_CLINIC_OR_DEPARTMENT_OTHER): Payer: Self-pay | Admitting: Certified Registered"

## 2012-11-20 ENCOUNTER — Encounter (HOSPITAL_BASED_OUTPATIENT_CLINIC_OR_DEPARTMENT_OTHER)
Admission: RE | Disposition: A | Discharge status: Home / Self Care | Payer: Self-pay | Source: Ambulatory Visit | Attending: Otolaryngology

## 2012-11-20 ENCOUNTER — Ambulatory Visit (HOSPITAL_BASED_OUTPATIENT_CLINIC_OR_DEPARTMENT_OTHER)
Admission: RE | Admit: 2012-11-20 | Discharge: 2012-11-21 | Disposition: A | Discharge status: Home / Self Care | Payer: Medicare Other | Source: Ambulatory Visit | Attending: Otolaryngology | Admitting: Otolaryngology

## 2012-11-20 ENCOUNTER — Ambulatory Visit (HOSPITAL_BASED_OUTPATIENT_CLINIC_OR_DEPARTMENT_OTHER): Payer: Medicare Other | Admitting: Certified Registered"

## 2012-11-20 ENCOUNTER — Encounter (HOSPITAL_BASED_OUTPATIENT_CLINIC_OR_DEPARTMENT_OTHER): Payer: Self-pay | Admitting: *Deleted

## 2012-11-20 DIAGNOSIS — E7800 Pure hypercholesterolemia, unspecified: Secondary | ICD-10-CM | POA: Insufficient documentation

## 2012-11-20 DIAGNOSIS — Z8249 Family history of ischemic heart disease and other diseases of the circulatory system: Secondary | ICD-10-CM | POA: Insufficient documentation

## 2012-11-20 DIAGNOSIS — K118 Other diseases of salivary glands: Secondary | ICD-10-CM

## 2012-11-20 DIAGNOSIS — I10 Essential (primary) hypertension: Secondary | ICD-10-CM | POA: Diagnosis not present

## 2012-11-20 DIAGNOSIS — E039 Hypothyroidism, unspecified: Secondary | ICD-10-CM | POA: Diagnosis not present

## 2012-11-20 DIAGNOSIS — D37039 Neoplasm of uncertain behavior of the major salivary glands, unspecified: Secondary | ICD-10-CM | POA: Diagnosis not present

## 2012-11-20 DIAGNOSIS — I889 Nonspecific lymphadenitis, unspecified: Secondary | ICD-10-CM | POA: Insufficient documentation

## 2012-11-20 DIAGNOSIS — D119 Benign neoplasm of major salivary gland, unspecified: Secondary | ICD-10-CM | POA: Diagnosis not present

## 2012-11-20 DIAGNOSIS — Z90710 Acquired absence of both cervix and uterus: Secondary | ICD-10-CM | POA: Insufficient documentation

## 2012-11-20 DIAGNOSIS — R221 Localized swelling, mass and lump, neck: Secondary | ICD-10-CM | POA: Diagnosis not present

## 2012-11-20 DIAGNOSIS — R220 Localized swelling, mass and lump, head: Secondary | ICD-10-CM | POA: Diagnosis not present

## 2012-11-20 DIAGNOSIS — Z87891 Personal history of nicotine dependence: Secondary | ICD-10-CM | POA: Insufficient documentation

## 2012-11-20 DIAGNOSIS — D360 Benign neoplasm of lymph nodes: Secondary | ICD-10-CM | POA: Diagnosis not present

## 2012-11-20 HISTORY — DX: Hypothyroidism, unspecified: E03.9

## 2012-11-20 HISTORY — DX: Essential (primary) hypertension: I10

## 2012-11-20 HISTORY — PX: PAROTIDECTOMY: SHX2163

## 2012-11-20 HISTORY — DX: Hyperlipidemia, unspecified: E78.5

## 2012-11-20 SURGERY — EXCISION, PAROTID GLAND
Anesthesia: General | Site: Neck | Laterality: Right | Wound class: Clean

## 2012-11-20 MED ORDER — CALCIUM CARBONATE 600 MG PO TABS: 600.0000 mg | ORAL_TABLET | Freq: Two times a day (BID) | ORAL | Status: DC

## 2012-11-20 MED ORDER — LACTATED RINGERS IV SOLN
INTRAVENOUS | Status: DC
  Administered 2012-11-20 (×2): via INTRAVENOUS

## 2012-11-20 MED ORDER — ONDANSETRON HCL 4 MG/2ML IJ SOLN: 4.0000 mg | INTRAMUSCULAR | Status: DC | PRN

## 2012-11-20 MED ORDER — OXYCODONE HCL 5 MG/5ML PO SOLN: 5.0000 mg | Freq: Once | ORAL | Status: AC | PRN

## 2012-11-20 MED ORDER — CEFAZOLIN SODIUM-DEXTROSE 2-3 GM-% IV SOLR
2.0000 g | INTRAVENOUS | Status: AC
  Administered 2012-11-20: 2 g via INTRAVENOUS

## 2012-11-20 MED ORDER — IBUPROFEN 100 MG/5ML PO SUSP
400.0000 mg | Freq: Four times a day (QID) | ORAL | Status: DC | PRN
  Administered 2012-11-21: 400 mg via ORAL

## 2012-11-20 MED ORDER — 0.9 % SODIUM CHLORIDE (POUR BTL) OPTIME
TOPICAL | Status: DC | PRN
  Administered 2012-11-20: 1000 mL

## 2012-11-20 MED ORDER — HYDROCODONE-ACETAMINOPHEN 7.5-325 MG PO TABS: 1.0000 | ORAL_TABLET | Freq: Four times a day (QID) | ORAL | Status: DC | PRN

## 2012-11-20 MED ORDER — PROPOFOL 10 MG/ML IV BOLUS
INTRAVENOUS | Status: DC | PRN
  Administered 2012-11-20: 50 mg via INTRAVENOUS
  Administered 2012-11-20: 200 mg via INTRAVENOUS

## 2012-11-20 MED ORDER — HYDROCHLOROTHIAZIDE 25 MG PO TABS: 25.0000 mg | ORAL_TABLET | Freq: Every day | ORAL | Status: DC

## 2012-11-20 MED ORDER — DEXAMETHASONE SODIUM PHOSPHATE 4 MG/ML IJ SOLN
INTRAMUSCULAR | Status: DC | PRN
  Administered 2012-11-20: 4 mg via INTRAVENOUS

## 2012-11-20 MED ORDER — ONDANSETRON HCL 4 MG/2ML IJ SOLN
INTRAMUSCULAR | Status: DC | PRN
  Administered 2012-11-20: 4 mg via INTRAVENOUS

## 2012-11-20 MED ORDER — DEXTROSE-NACL 5-0.9 % IV SOLN
INTRAVENOUS | Status: DC
  Administered 2012-11-20: 11:00:00 via INTRAVENOUS

## 2012-11-20 MED ORDER — FENTANYL CITRATE 0.05 MG/ML IJ SOLN
INTRAMUSCULAR | Status: DC | PRN
  Administered 2012-11-20: 50 ug via INTRAVENOUS
  Administered 2012-11-20: 100 ug via INTRAVENOUS
  Administered 2012-11-20: 50 ug via INTRAVENOUS

## 2012-11-20 MED ORDER — HYDROCODONE-ACETAMINOPHEN 5-325 MG PO TABS
1.0000 | ORAL_TABLET | ORAL | Status: DC | PRN
  Administered 2012-11-20: 2 via ORAL

## 2012-11-20 MED ORDER — SUCCINYLCHOLINE CHLORIDE 20 MG/ML IJ SOLN
INTRAMUSCULAR | Status: DC | PRN
  Administered 2012-11-20: 120 mg via INTRAVENOUS

## 2012-11-20 MED ORDER — LIDOCAINE HCL (CARDIAC) 20 MG/ML IV SOLN
INTRAVENOUS | Status: DC | PRN
  Administered 2012-11-20: 40 mg via INTRAVENOUS
  Administered 2012-11-20: 60 mg via INTRAVENOUS

## 2012-11-20 MED ORDER — PROMETHAZINE HCL 25 MG/ML IJ SOLN
6.2500 mg | INTRAMUSCULAR | Status: DC | PRN
  Administered 2012-11-20: 6.25 mg via INTRAVENOUS

## 2012-11-20 MED ORDER — HYDROMORPHONE HCL PF 1 MG/ML IJ SOLN: 0.2500 mg | INTRAMUSCULAR | Status: DC | PRN

## 2012-11-20 MED ORDER — ACETAMINOPHEN 10 MG/ML IV SOLN
INTRAVENOUS | Status: DC | PRN
  Administered 2012-11-20: 1000 mg via INTRAVENOUS

## 2012-11-20 MED ORDER — ONDANSETRON HCL 4 MG PO TABS: 4.0000 mg | ORAL_TABLET | ORAL | Status: DC | PRN

## 2012-11-20 MED ORDER — MIDAZOLAM HCL 5 MG/5ML IJ SOLN
INTRAMUSCULAR | Status: DC | PRN
  Administered 2012-11-20: 1 mg via INTRAVENOUS

## 2012-11-20 MED ORDER — OXYCODONE HCL 5 MG PO TABS: 5.0000 mg | ORAL_TABLET | Freq: Once | ORAL | Status: AC | PRN

## 2012-11-20 MED ORDER — SIMVASTATIN 20 MG PO TABS
20.0000 mg | ORAL_TABLET | Freq: Every evening | ORAL | Status: DC
  Administered 2012-11-20: 20 mg via ORAL

## 2012-11-20 MED ORDER — ONDANSETRON 4 MG PO TBDP: 8.0000 mg | ORAL_TABLET | Freq: Three times a day (TID) | ORAL | Status: DC | PRN

## 2012-11-20 MED ORDER — ONDANSETRON HCL 4 MG/2ML IJ SOLN: 4.0000 mg | Freq: Once | INTRAMUSCULAR | Status: AC | PRN

## 2012-11-20 MED ORDER — LEVOTHYROXINE SODIUM 50 MCG PO TABS: 50.0000 ug | ORAL_TABLET | Freq: Every day | ORAL | Status: DC

## 2012-11-20 MED ORDER — QUINAPRIL HCL 10 MG PO TABS: 40.0000 mg | ORAL_TABLET | Freq: Every day | ORAL | Status: DC

## 2012-11-20 MED ORDER — OMEGA-3 FATTY ACIDS 1000 MG PO CAPS: 2.0000 g | ORAL_CAPSULE | Freq: Every day | ORAL | Status: DC

## 2012-11-20 SURGICAL SUPPLY — 63 items
ATTRACTOMAT 16X20 MAGNETIC DRP (DRAPES) ×2 IMPLANT
BENZOIN TINCTURE PRP APPL 2/3 (GAUZE/BANDAGES/DRESSINGS) IMPLANT
BLADE SURG 15 STRL LF DISP TIS (BLADE) ×1 IMPLANT
BLADE SURG 15 STRL SS (BLADE) ×1
CANISTER SUCTION 1200CC (MISCELLANEOUS) ×2 IMPLANT
CLEANER CAUTERY TIP 5X5 PAD (MISCELLANEOUS) ×1 IMPLANT
CLOTH BEACON ORANGE TIMEOUT ST (SAFETY) ×2 IMPLANT
CORDS BIPOLAR (ELECTRODE) ×2 IMPLANT
COVER MAYO STAND STRL (DRAPES) ×2 IMPLANT
COVER TABLE BACK 60X90 (DRAPES) ×2 IMPLANT
DERMABOND ADVANCED (GAUZE/BANDAGES/DRESSINGS) ×1
DERMABOND ADVANCED .7 DNX12 (GAUZE/BANDAGES/DRESSINGS) ×1 IMPLANT
DRAIN JACKSON RD 7FR 3/32 (WOUND CARE) ×2 IMPLANT
DRAIN TLS ROUND 10FR (DRAIN) IMPLANT
DRAPE INCISE 23X17 IOBAN STRL (DRAPES) ×1
DRAPE INCISE IOBAN 23X17 STRL (DRAPES) ×1 IMPLANT
DRAPE U-SHAPE 76X120 STRL (DRAPES) ×2 IMPLANT
ELECT COATED BLADE 2.86 ST (ELECTRODE) ×2 IMPLANT
ELECT REM PT RETURN 9FT ADLT (ELECTROSURGICAL) ×2
ELECTRODE REM PT RTRN 9FT ADLT (ELECTROSURGICAL) ×1 IMPLANT
EVACUATOR SILICONE 100CC (DRAIN) ×2 IMPLANT
GAUZE SPONGE 4X4 12PLY STRL LF (GAUZE/BANDAGES/DRESSINGS) IMPLANT
GAUZE SPONGE 4X4 16PLY XRAY LF (GAUZE/BANDAGES/DRESSINGS) ×2 IMPLANT
GLOVE BIO SURGEON STRL SZ 6.5 (GLOVE) ×2 IMPLANT
GLOVE BIO SURGEON STRL SZ7 (GLOVE) ×2 IMPLANT
GLOVE BIO SURGEON STRL SZ7.5 (GLOVE) IMPLANT
GLOVE BIOGEL PI IND STRL 8 (GLOVE) IMPLANT
GLOVE BIOGEL PI INDICATOR 8 (GLOVE)
GLOVE ECLIPSE 7.5 STRL STRAW (GLOVE) ×2 IMPLANT
GLOVE ECLIPSE 8.0 STRL XLNG CF (GLOVE) IMPLANT
GLOVE SKINSENSE NS SZ7.0 (GLOVE) ×1
GLOVE SKINSENSE STRL SZ7.0 (GLOVE) ×1 IMPLANT
GLOVE SS BIOGEL STRL SZ 7.5 (GLOVE) IMPLANT
GLOVE SUPERSENSE BIOGEL SZ 7.5 (GLOVE)
GOWN PREVENTION PLUS XLARGE (GOWN DISPOSABLE) ×4 IMPLANT
GOWN PREVENTION PLUS XXLARGE (GOWN DISPOSABLE) ×2 IMPLANT
LOCATOR NERVE 3 VOLT (DISPOSABLE) ×2 IMPLANT
NEEDLE 27GAX1X1/2 (NEEDLE) IMPLANT
NEEDLE HYPO 25X1 1.5 SAFETY (NEEDLE) IMPLANT
NS IRRIG 1000ML POUR BTL (IV SOLUTION) ×2 IMPLANT
PACK BASIN DAY SURGERY FS (CUSTOM PROCEDURE TRAY) ×2 IMPLANT
PAD CLEANER CAUTERY TIP 5X5 (MISCELLANEOUS) ×1
PENCIL FOOT CONTROL (ELECTRODE) ×2 IMPLANT
PIN SAFETY STERILE (MISCELLANEOUS) ×2 IMPLANT
SHEARS HARMONIC 9CM CVD (BLADE) ×2 IMPLANT
SHEET MEDIUM DRAPE 40X70 STRL (DRAPES) IMPLANT
SLEEVE SCD COMPRESS KNEE MED (MISCELLANEOUS) ×2 IMPLANT
STAPLER VISISTAT 35W (STAPLE) IMPLANT
STRIP CLOSURE SKIN 1/2X4 (GAUZE/BANDAGES/DRESSINGS) IMPLANT
SUCTION FRAZIER TIP 10 FR DISP (SUCTIONS) IMPLANT
SUT CHROMIC 3 0 PS 2 (SUTURE) ×2 IMPLANT
SUT ETHILON 3 0 PS 1 (SUTURE) ×2 IMPLANT
SUT ETHILON 5 0 P 3 18 (SUTURE)
SUT NYLON ETHILON 5-0 P-3 1X18 (SUTURE) IMPLANT
SUT PLAIN 5 0 P 3 18 (SUTURE) IMPLANT
SUT SILK 3 0 PS 1 (SUTURE) ×2 IMPLANT
SUT SILK 3 0 SH CR/8 (SUTURE) IMPLANT
SUT SILK 4 0 TIES 17X18 (SUTURE) ×2 IMPLANT
SYR BULB 3OZ (MISCELLANEOUS) ×2 IMPLANT
SYR CONTROL 10ML LL (SYRINGE) IMPLANT
TOWEL OR 17X24 6PK STRL BLUE (TOWEL DISPOSABLE) ×4 IMPLANT
TUBE CONNECTING 20X1/4 (TUBING) ×2 IMPLANT
WATER STERILE IRR 1000ML POUR (IV SOLUTION) IMPLANT

## 2012-11-20 NOTE — Anesthesia Procedure Notes (Signed)
Procedure Name: Intubation Date/Time: 11/20/2012 7:38 AM Performed by: Verlan Friends Pre-anesthesia Checklist: Patient identified, Emergency Drugs available, Suction available, Patient being monitored and Timeout performed Patient Re-evaluated:Patient Re-evaluated prior to inductionOxygen Delivery Method: Circle System Utilized Preoxygenation: Pre-oxygenation with 100% oxygen Intubation Type: IV induction Ventilation: Mask ventilation without difficulty Laryngoscope Size: Miller and 3 Grade View: Grade III Tube type: Oral Tube size: 7.0 mm Number of attempts: 1 Airway Equipment and Method: stylet Placement Confirmation: ETT inserted through vocal cords under direct vision,  positive ETCO2 and breath sounds checked- equal and bilateral Secured at: 22 cm Tube secured with: Tape Dental Injury: Teeth and Oropharynx as per pre-operative assessment

## 2012-11-20 NOTE — Interval H&P Note (Signed)
History and Physical Interval Note:  11/20/2012 7:22 AM  Tammie Hammond  has presented today for surgery, with the diagnosis of RIGHT PARAOTID MASS  The various methods of treatment have been discussed with the patient and family. After consideration of risks, benefits and other options for treatment, the patient has consented to  Procedure(s) (LRB) with comments: PAROTIDECTOMY (Right) as a surgical intervention .  The patient's history has been reviewed, patient examined, no change in status, stable for surgery.  I have reviewed the patient's chart and labs.  Questions were answered to the patient's satisfaction.     Mandy Fitzwater

## 2012-11-20 NOTE — Transfer of Care (Signed)
Immediate Anesthesia Transfer of Care Note  Patient: Tammie Hammond  Procedure(s) Performed: Procedure(s) (LRB) with comments: PAROTIDECTOMY (Right)  Patient Location: PACU  Anesthesia Type:General  Level of Consciousness: awake, alert , oriented and patient cooperative  Airway & Oxygen Therapy: Patient Spontanous Breathing and Patient connected to face mask oxygen  Post-op Assessment: Report given to PACU RN and Post -op Vital signs reviewed and stable  Post vital signs: Reviewed and stable  Complications: No apparent anesthesia complications

## 2012-11-20 NOTE — Anesthesia Preprocedure Evaluation (Addendum)
Anesthesia Evaluation  Patient identified by MRN, date of birth, ID band Patient awake    Reviewed: Allergy & Precautions, H&P , NPO status , Patient's Chart, lab work & pertinent test results  Airway Mallampati: I TM Distance: >3 FB Neck ROM: Full    Dental  (+) Teeth Intact and Dental Advisory Given   Pulmonary  breath sounds clear to auscultation        Cardiovascular hypertension, Pt. on medications Rhythm:Regular Rate:Normal     Neuro/Psych    GI/Hepatic   Endo/Other  Morbid obesity  Renal/GU      Musculoskeletal   Abdominal   Peds  Hematology   Anesthesia Other Findings   Reproductive/Obstetrics                          Anesthesia Physical Anesthesia Plan  ASA: III  Anesthesia Plan: General   Post-op Pain Management:    Induction: Intravenous  Airway Management Planned: Oral ETT  Additional Equipment:   Intra-op Plan:   Post-operative Plan: Extubation in OR  Informed Consent: I have reviewed the patients History and Physical, chart, labs and discussed the procedure including the risks, benefits and alternatives for the proposed anesthesia with the patient or authorized representative who has indicated his/her understanding and acceptance.   Dental advisory given  Plan Discussed with: CRNA, Anesthesiologist and Surgeon  Anesthesia Plan Comments:         Anesthesia Quick Evaluation

## 2012-11-20 NOTE — Op Note (Signed)
OPERATIVE REPORT  DATE OF SURGERY: 11/20/2012  PATIENT:  Tammie Hammond,  67 y.o. female  PRE-OPERATIVE DIAGNOSIS:  RIGHT PAROTID MASS  POST-OPERATIVE DIAGNOSIS:  RIGHT PAROTID MASS  PROCEDURE:  Procedure(s): PAROTIDECTOMY  SURGEON:  Susy Frizzle, MD  ASSISTANTS: Aquilla Hacker, PA  ANESTHESIA:   General   EBL:  20 ml  DRAINS: XXX   LOCAL MEDICATIONS USED:  None  SPECIMEN:  Right parotid mass   COUNTS:  Correct  PROCEDURE DETAILS: The patient was taken to the operating room and placed on the operating table in the supine position. Following induction of general endotracheal anesthesia, the right side of the face was prepped and draped in a standard fashion. A preauricular incision was outlined with a marking pen with extension around the mastoid tip and into the upper cervical skin crease. Electrocautery was used to incise the skin and subcutaneous kidneys tissue. The posterior branch of the greater regular nerve was identified and preserved. The parotid tail was dissected off of the sternocleidomastoid muscle. Continued dissection revealed the posterior belly of the digastric muscle. The gland was dissected off of the external auditory canal. The main trunk of the facial nerve was identified. A McCabe dissector was then used to dissected along the main trunk, through the pes anserinus, and through all the branches of the upper and lower division. A bluish cystic appearing mass was identified and was contained within the specimen. The harmonic dissector was used to dissect through the glandular tissue. The specimen was removed and sent for pathologic evaluation. The wound was irrigated with saline. Bipolar cautery was used as needed for completion of hemostasis. A battery operated nerve stimulator was used to assess the integrity of the nerve and all branches are functional. A 7 [place] round J-P drain was left in the wound exiting through the lower part of the incision and secured in  place with nylon suture. Subcuticular closure was accomplished using 3-0 chromic suture. Dermabond was used on the skin. Patient was then awakened, extubated and transferred to recovery in stable condition.    PATIENT DISPOSITION:  To PACU, stable

## 2012-11-20 NOTE — Progress Notes (Signed)
11/20/2012 6:34 PM  Tammie Hammond [id number]  Post op 0     Temp:  [97.7 F (36.5 C)-98.2 F (36.8 C)] 98 F (36.7 C) (01/13 1500) Pulse Rate:  [56-75] 72  (01/13 1500) Resp:  [13-20] 16  (01/13 1500) BP: (107-162)/(43-74) 121/73 mmHg (01/13 1500) SpO2:  [91 %-100 %] 95 % (01/13 1500) Weight:  [97.523 kg (215 lb)] 97.523 kg (215 lb) (01/13 0644),     Intake/Output Summary (Last 24 hours) at 11/20/12 1834 Last data filed at 11/20/12 1600  Gross per 24 hour  Intake   2080 ml  Output 1215.5 ml  Net  864.5 ml    Results for orders placed during the hospital encounter of 11/20/12 (from the past 24 hour(s))  POCT HEMOGLOBIN-HEMACUE     Status: Normal   Collection Time   11/20/12  7:35 AM      Component Value Range   Hemoglobin 13.1  12.0 - 15.0 g/dL    SUBJECTIVE:  No pain.  No sob, chest pain.  Breathing well, eating well.  +void.  OBJECTIVE:  Wound flat and intact.  Drain functional.  Face completely intact.  IMPRESSION:  Satisfactory check  PLAN:  Routine.  Probable drain out and d/c home in AM.  [place], [place]

## 2012-11-20 NOTE — Anesthesia Postprocedure Evaluation (Signed)
  Anesthesia Post-op Note  Patient: Tammie Hammond  Procedure(s) Performed: Procedure(s) (LRB) with comments: PAROTIDECTOMY (Right)  Patient Location: PACU  Anesthesia Type:General  Level of Consciousness: awake, alert  and oriented  Airway and Oxygen Therapy: Patient Spontanous Breathing and Patient connected to face mask oxygen  Post-op Pain: mild  Post-op Assessment: Post-op Vital signs reviewed  Post-op Vital Signs: Reviewed  Complications: No apparent anesthesia complications

## 2012-11-21 ENCOUNTER — Encounter (HOSPITAL_BASED_OUTPATIENT_CLINIC_OR_DEPARTMENT_OTHER): Payer: Self-pay | Admitting: Otolaryngology

## 2012-11-21 NOTE — Discharge Summary (Signed)
Physician Discharge Summary  Patient ID: Tammie Hammond MRN: [id number] DOB/AGE: April 21, 1946 67 y.o.  Admit date: 11/20/2012 Discharge date: 11/21/2012  Admission Diagnoses:Parotid mass  Discharge Diagnoses:  Active Problems:  * No active hospital problems. *    Discharged Condition: good  Hospital Course: no complications  Consults: none  Significant Diagnostic Studies: none  Treatments: surgery: Parotidectomy  Discharge Exam: Blood pressure 130/63, pulse 66, temperature 98 F (36.7 C), temperature source Oral, resp. rate 16, height 5\' 3"  (1.6 m), weight 215 lb (97.523 kg), SpO2 94.00%. PHYSICAL EXAM: Facial nerve with 100% normal function. JP removed.   Disposition:   Discharge Orders    Future Orders Please Complete By Expires   Diet - low sodium heart healthy      Increase activity slowly          Medication List     As of 11/21/2012  8:48 AM    TAKE these medications         aspirin 81 MG tablet   Take 81 mg by mouth daily.      calcium carbonate 600 MG Tabs   Commonly known as: OS-CAL   Take 600 mg by mouth 2 (two) times daily with a meal.      fish oil-omega-3 fatty acids 1000 MG capsule   Take 2 g by mouth daily.      hydrochlorothiazide 25 MG tablet   Commonly known as: HYDRODIURIL   Take 25 mg by mouth daily.      HYDROcodone-acetaminophen 7.5-325 MG per tablet   Commonly known as: NORCO   Take 1 tablet by mouth every 6 (six) hours as needed for pain.      levothyroxine 50 MCG tablet   Commonly known as: SYNTHROID, LEVOTHROID   Take 50 mcg by mouth daily.      ondansetron 4 MG disintegrating tablet   Commonly known as: ZOFRAN-ODT   Take 2 tablets (8 mg total) by mouth every 8 (eight) hours as needed for nausea.      quinapril 40 MG tablet   Commonly known as: ACCUPRIL   Take 40 mg by mouth daily.      simvastatin 20 MG tablet   Commonly known as: ZOCOR   Take 20 mg by mouth every evening.           Follow-up Information    Follow up with Serena Colonel, MD. Schedule an appointment as soon as possible for a visit in 1 week.   Contact information:   [address], [address] [place], [place] [address] [place] [address] [phone]          Signed: Serena Colonel 11/21/2012, 8:48 AM

## 2013-04-10 DIAGNOSIS — G479 Sleep disorder, unspecified: Secondary | ICD-10-CM | POA: Diagnosis not present

## 2013-04-10 DIAGNOSIS — Z1331 Encounter for screening for depression: Secondary | ICD-10-CM | POA: Diagnosis not present

## 2013-04-10 DIAGNOSIS — I10 Essential (primary) hypertension: Secondary | ICD-10-CM | POA: Diagnosis not present

## 2013-04-10 DIAGNOSIS — R7309 Other abnormal glucose: Secondary | ICD-10-CM | POA: Diagnosis not present

## 2013-04-10 DIAGNOSIS — E785 Hyperlipidemia, unspecified: Secondary | ICD-10-CM | POA: Diagnosis not present

## 2013-04-10 DIAGNOSIS — E039 Hypothyroidism, unspecified: Secondary | ICD-10-CM | POA: Diagnosis not present

## 2013-04-11 DIAGNOSIS — H259 Unspecified age-related cataract: Secondary | ICD-10-CM | POA: Diagnosis not present

## 2013-04-11 DIAGNOSIS — H25099 Other age-related incipient cataract, unspecified eye: Secondary | ICD-10-CM | POA: Diagnosis not present

## 2013-04-11 DIAGNOSIS — H5200 Hypermetropia, unspecified eye: Secondary | ICD-10-CM | POA: Diagnosis not present

## 2013-04-11 DIAGNOSIS — H52229 Regular astigmatism, unspecified eye: Secondary | ICD-10-CM | POA: Diagnosis not present

## 2013-04-27 DIAGNOSIS — Z1231 Encounter for screening mammogram for malignant neoplasm of breast: Secondary | ICD-10-CM | POA: Diagnosis not present

## 2013-05-15 DIAGNOSIS — J029 Acute pharyngitis, unspecified: Secondary | ICD-10-CM | POA: Diagnosis not present

## 2013-07-30 DIAGNOSIS — Z23 Encounter for immunization: Secondary | ICD-10-CM | POA: Diagnosis not present

## 2013-12-07 DIAGNOSIS — R7309 Other abnormal glucose: Secondary | ICD-10-CM | POA: Diagnosis not present

## 2013-12-07 DIAGNOSIS — D696 Thrombocytopenia, unspecified: Secondary | ICD-10-CM | POA: Diagnosis not present

## 2013-12-07 DIAGNOSIS — E039 Hypothyroidism, unspecified: Secondary | ICD-10-CM | POA: Diagnosis not present

## 2013-12-07 DIAGNOSIS — E785 Hyperlipidemia, unspecified: Secondary | ICD-10-CM | POA: Diagnosis not present

## 2013-12-07 DIAGNOSIS — I10 Essential (primary) hypertension: Secondary | ICD-10-CM | POA: Diagnosis not present

## 2014-04-15 DIAGNOSIS — M25519 Pain in unspecified shoulder: Secondary | ICD-10-CM | POA: Diagnosis not present

## 2014-04-17 DIAGNOSIS — M7500 Adhesive capsulitis of unspecified shoulder: Secondary | ICD-10-CM | POA: Diagnosis not present

## 2014-04-29 DIAGNOSIS — H25019 Cortical age-related cataract, unspecified eye: Secondary | ICD-10-CM | POA: Diagnosis not present

## 2014-04-29 DIAGNOSIS — E7800 Pure hypercholesterolemia, unspecified: Secondary | ICD-10-CM | POA: Diagnosis not present

## 2014-04-29 DIAGNOSIS — D3130 Benign neoplasm of unspecified choroid: Secondary | ICD-10-CM | POA: Diagnosis not present

## 2014-04-29 DIAGNOSIS — H2510 Age-related nuclear cataract, unspecified eye: Secondary | ICD-10-CM | POA: Diagnosis not present

## 2014-05-15 DIAGNOSIS — M7500 Adhesive capsulitis of unspecified shoulder: Secondary | ICD-10-CM | POA: Diagnosis not present

## 2014-06-04 DIAGNOSIS — I10 Essential (primary) hypertension: Secondary | ICD-10-CM | POA: Diagnosis not present

## 2014-06-04 DIAGNOSIS — R7309 Other abnormal glucose: Secondary | ICD-10-CM | POA: Diagnosis not present

## 2014-06-04 DIAGNOSIS — E785 Hyperlipidemia, unspecified: Secondary | ICD-10-CM | POA: Diagnosis not present

## 2014-06-04 DIAGNOSIS — E039 Hypothyroidism, unspecified: Secondary | ICD-10-CM | POA: Diagnosis not present

## 2014-06-04 DIAGNOSIS — Z1211 Encounter for screening for malignant neoplasm of colon: Secondary | ICD-10-CM | POA: Diagnosis not present

## 2014-06-18 DIAGNOSIS — Z1211 Encounter for screening for malignant neoplasm of colon: Secondary | ICD-10-CM | POA: Diagnosis not present

## 2014-08-07 DIAGNOSIS — Z23 Encounter for immunization: Secondary | ICD-10-CM | POA: Diagnosis not present

## 2014-11-14 DIAGNOSIS — Z1231 Encounter for screening mammogram for malignant neoplasm of breast: Secondary | ICD-10-CM | POA: Diagnosis not present

## 2014-12-11 DIAGNOSIS — R739 Hyperglycemia, unspecified: Secondary | ICD-10-CM | POA: Diagnosis not present

## 2014-12-11 DIAGNOSIS — E039 Hypothyroidism, unspecified: Secondary | ICD-10-CM | POA: Diagnosis not present

## 2014-12-11 DIAGNOSIS — D696 Thrombocytopenia, unspecified: Secondary | ICD-10-CM | POA: Diagnosis not present

## 2014-12-11 DIAGNOSIS — E785 Hyperlipidemia, unspecified: Secondary | ICD-10-CM | POA: Diagnosis not present

## 2014-12-11 DIAGNOSIS — I10 Essential (primary) hypertension: Secondary | ICD-10-CM | POA: Diagnosis not present

## 2014-12-11 DIAGNOSIS — R350 Frequency of micturition: Secondary | ICD-10-CM | POA: Diagnosis not present

## 2015-06-11 DIAGNOSIS — E039 Hypothyroidism, unspecified: Secondary | ICD-10-CM | POA: Diagnosis not present

## 2015-06-11 DIAGNOSIS — E785 Hyperlipidemia, unspecified: Secondary | ICD-10-CM | POA: Diagnosis not present

## 2015-06-11 DIAGNOSIS — M791 Myalgia: Secondary | ICD-10-CM | POA: Diagnosis not present

## 2015-06-11 DIAGNOSIS — R739 Hyperglycemia, unspecified: Secondary | ICD-10-CM | POA: Diagnosis not present

## 2015-06-11 DIAGNOSIS — I10 Essential (primary) hypertension: Secondary | ICD-10-CM | POA: Diagnosis not present

## 2015-08-09 DIAGNOSIS — Z23 Encounter for immunization: Secondary | ICD-10-CM | POA: Diagnosis not present

## 2015-12-15 DIAGNOSIS — I10 Essential (primary) hypertension: Secondary | ICD-10-CM | POA: Diagnosis not present

## 2015-12-15 DIAGNOSIS — R739 Hyperglycemia, unspecified: Secondary | ICD-10-CM | POA: Diagnosis not present

## 2015-12-15 DIAGNOSIS — E039 Hypothyroidism, unspecified: Secondary | ICD-10-CM | POA: Diagnosis not present

## 2015-12-15 DIAGNOSIS — E7800 Pure hypercholesterolemia, unspecified: Secondary | ICD-10-CM | POA: Diagnosis not present

## 2015-12-15 DIAGNOSIS — D696 Thrombocytopenia, unspecified: Secondary | ICD-10-CM | POA: Diagnosis not present

## 2016-06-21 DIAGNOSIS — E039 Hypothyroidism, unspecified: Secondary | ICD-10-CM | POA: Diagnosis not present

## 2016-06-21 DIAGNOSIS — Z1211 Encounter for screening for malignant neoplasm of colon: Secondary | ICD-10-CM | POA: Diagnosis not present

## 2016-06-21 DIAGNOSIS — E7800 Pure hypercholesterolemia, unspecified: Secondary | ICD-10-CM | POA: Diagnosis not present

## 2016-06-21 DIAGNOSIS — R7301 Impaired fasting glucose: Secondary | ICD-10-CM | POA: Diagnosis not present

## 2016-06-21 DIAGNOSIS — D696 Thrombocytopenia, unspecified: Secondary | ICD-10-CM | POA: Diagnosis not present

## 2016-06-21 DIAGNOSIS — I10 Essential (primary) hypertension: Secondary | ICD-10-CM | POA: Diagnosis not present

## 2016-07-09 DIAGNOSIS — Z1211 Encounter for screening for malignant neoplasm of colon: Secondary | ICD-10-CM | POA: Diagnosis not present

## 2016-08-10 DIAGNOSIS — Z23 Encounter for immunization: Secondary | ICD-10-CM | POA: Diagnosis not present

## 2016-12-22 DIAGNOSIS — I10 Essential (primary) hypertension: Secondary | ICD-10-CM | POA: Diagnosis not present

## 2016-12-22 DIAGNOSIS — D696 Thrombocytopenia, unspecified: Secondary | ICD-10-CM | POA: Diagnosis not present

## 2016-12-22 DIAGNOSIS — R7303 Prediabetes: Secondary | ICD-10-CM | POA: Diagnosis not present

## 2016-12-22 DIAGNOSIS — E7800 Pure hypercholesterolemia, unspecified: Secondary | ICD-10-CM | POA: Diagnosis not present

## 2016-12-22 DIAGNOSIS — E039 Hypothyroidism, unspecified: Secondary | ICD-10-CM | POA: Diagnosis not present

## 2017-05-30 DIAGNOSIS — Z1231 Encounter for screening mammogram for malignant neoplasm of breast: Secondary | ICD-10-CM | POA: Diagnosis not present

## 2017-06-22 DIAGNOSIS — E7800 Pure hypercholesterolemia, unspecified: Secondary | ICD-10-CM | POA: Diagnosis not present

## 2017-06-22 DIAGNOSIS — I10 Essential (primary) hypertension: Secondary | ICD-10-CM | POA: Diagnosis not present

## 2017-06-22 DIAGNOSIS — D696 Thrombocytopenia, unspecified: Secondary | ICD-10-CM | POA: Diagnosis not present

## 2017-06-22 DIAGNOSIS — E039 Hypothyroidism, unspecified: Secondary | ICD-10-CM | POA: Diagnosis not present

## 2017-06-22 DIAGNOSIS — R7303 Prediabetes: Secondary | ICD-10-CM | POA: Diagnosis not present

## 2017-06-22 DIAGNOSIS — Z1211 Encounter for screening for malignant neoplasm of colon: Secondary | ICD-10-CM | POA: Diagnosis not present

## 2017-07-15 DIAGNOSIS — Z1211 Encounter for screening for malignant neoplasm of colon: Secondary | ICD-10-CM | POA: Diagnosis not present

## 2017-07-28 DIAGNOSIS — Z23 Encounter for immunization: Secondary | ICD-10-CM | POA: Diagnosis not present

## 2017-11-03 DIAGNOSIS — J209 Acute bronchitis, unspecified: Secondary | ICD-10-CM | POA: Diagnosis not present

## 2017-12-23 DIAGNOSIS — I10 Essential (primary) hypertension: Secondary | ICD-10-CM | POA: Diagnosis not present

## 2017-12-23 DIAGNOSIS — E039 Hypothyroidism, unspecified: Secondary | ICD-10-CM | POA: Diagnosis not present

## 2017-12-23 DIAGNOSIS — E7800 Pure hypercholesterolemia, unspecified: Secondary | ICD-10-CM | POA: Diagnosis not present

## 2017-12-23 DIAGNOSIS — R7303 Prediabetes: Secondary | ICD-10-CM | POA: Diagnosis not present

## 2017-12-23 DIAGNOSIS — D696 Thrombocytopenia, unspecified: Secondary | ICD-10-CM | POA: Diagnosis not present

## 2017-12-23 DIAGNOSIS — J0101 Acute recurrent maxillary sinusitis: Secondary | ICD-10-CM | POA: Diagnosis not present

## 2018-05-31 DIAGNOSIS — Z1231 Encounter for screening mammogram for malignant neoplasm of breast: Secondary | ICD-10-CM | POA: Diagnosis not present

## 2018-06-21 DIAGNOSIS — Z23 Encounter for immunization: Secondary | ICD-10-CM | POA: Diagnosis not present

## 2018-06-21 DIAGNOSIS — S61219A Laceration without foreign body of unspecified finger without damage to nail, initial encounter: Secondary | ICD-10-CM | POA: Diagnosis not present

## 2018-06-23 DIAGNOSIS — L570 Actinic keratosis: Secondary | ICD-10-CM | POA: Diagnosis not present

## 2018-06-23 DIAGNOSIS — E7800 Pure hypercholesterolemia, unspecified: Secondary | ICD-10-CM | POA: Diagnosis not present

## 2018-06-23 DIAGNOSIS — D696 Thrombocytopenia, unspecified: Secondary | ICD-10-CM | POA: Diagnosis not present

## 2018-06-23 DIAGNOSIS — E039 Hypothyroidism, unspecified: Secondary | ICD-10-CM | POA: Diagnosis not present

## 2018-06-23 DIAGNOSIS — I10 Essential (primary) hypertension: Secondary | ICD-10-CM | POA: Diagnosis not present

## 2018-06-23 DIAGNOSIS — R7303 Prediabetes: Secondary | ICD-10-CM | POA: Diagnosis not present

## 2018-07-17 DIAGNOSIS — R42 Dizziness and giddiness: Secondary | ICD-10-CM | POA: Diagnosis not present

## 2018-08-08 DIAGNOSIS — Z23 Encounter for immunization: Secondary | ICD-10-CM | POA: Diagnosis not present

## 2018-12-25 DIAGNOSIS — I10 Essential (primary) hypertension: Secondary | ICD-10-CM | POA: Diagnosis not present

## 2018-12-25 DIAGNOSIS — E039 Hypothyroidism, unspecified: Secondary | ICD-10-CM | POA: Diagnosis not present

## 2018-12-25 DIAGNOSIS — R7303 Prediabetes: Secondary | ICD-10-CM | POA: Diagnosis not present

## 2018-12-25 DIAGNOSIS — Z1211 Encounter for screening for malignant neoplasm of colon: Secondary | ICD-10-CM | POA: Diagnosis not present

## 2018-12-25 DIAGNOSIS — E7800 Pure hypercholesterolemia, unspecified: Secondary | ICD-10-CM | POA: Diagnosis not present

## 2018-12-25 DIAGNOSIS — Z0000 Encounter for general adult medical examination without abnormal findings: Secondary | ICD-10-CM | POA: Diagnosis not present

## 2019-03-17 DIAGNOSIS — T464X5A Adverse effect of angiotensin-converting-enzyme inhibitors, initial encounter: Secondary | ICD-10-CM | POA: Diagnosis not present

## 2019-03-17 DIAGNOSIS — I10 Essential (primary) hypertension: Secondary | ICD-10-CM | POA: Diagnosis not present

## 2019-03-17 DIAGNOSIS — T783XXA Angioneurotic edema, initial encounter: Secondary | ICD-10-CM | POA: Diagnosis not present

## 2019-03-22 DIAGNOSIS — I10 Essential (primary) hypertension: Secondary | ICD-10-CM | POA: Diagnosis not present

## 2019-04-04 DIAGNOSIS — R040 Epistaxis: Secondary | ICD-10-CM | POA: Diagnosis not present

## 2019-04-04 DIAGNOSIS — J3489 Other specified disorders of nose and nasal sinuses: Secondary | ICD-10-CM | POA: Diagnosis not present

## 2019-05-03 ENCOUNTER — Ambulatory Visit (INDEPENDENT_AMBULATORY_CARE_PROVIDER_SITE_OTHER): Payer: Medicare Other | Admitting: Allergy

## 2019-05-03 ENCOUNTER — Encounter: Payer: Self-pay | Admitting: Allergy

## 2019-05-03 ENCOUNTER — Other Ambulatory Visit: Payer: Self-pay

## 2019-05-03 VITALS — BP 172/78 | HR 71 | Temp 98.7°F | Resp 14 | Ht 63.5 in | Wt 211.0 lb

## 2019-05-03 DIAGNOSIS — Z9109 Other allergy status, other than to drugs and biological substances: Secondary | ICD-10-CM | POA: Insufficient documentation

## 2019-05-03 DIAGNOSIS — T783XXD Angioneurotic edema, subsequent encounter: Secondary | ICD-10-CM | POA: Diagnosis not present

## 2019-05-03 DIAGNOSIS — T783XXA Angioneurotic edema, initial encounter: Secondary | ICD-10-CM

## 2019-05-03 HISTORY — DX: Angioneurotic edema, initial encounter: T78.3XXA

## 2019-05-03 MED ORDER — CETIRIZINE HCL 10 MG PO TABS: 10.0000 mg | ORAL_TABLET | Freq: Every day | ORAL | 0 refills | Status: DC

## 2019-05-03 NOTE — Progress Notes (Signed)
New Patient Note  RE: Tammie Hammond MRN: [id number] DOB: 18-Jan-1946 Date of Office Visit: 05/03/2019  Referring provider: [name], MD Primary care provider: [name], MD  Chief Complaint: Establish Care (allergies, 2.5 months ago tongue swole up after dinner)  History of Present Illness: I had the pleasure of seeing Tammie Hammond for initial evaluation at the [facility] on 05/03/2019. She is a 73 y.o. female, who is referred here by [name], MD for the evaluation of angioedema.   About 2 months ago patient developed tongue swelling 1 hour after supper. She took 2 benadryl which did not help but by 1AM the tongue swelling has improved and by the following morning it resolved. She had stir fry made with broccoli, cauliflower, peppers, onions, carrots, steak, soy sauce, rice. She has eaten these foods except for soy sauce with no issues since then.   On May 9th, she woke up with tongue swelling and took 2 benadryl and took 2 more benadryl by 9AM. She had hamburger and french fries at that time. She was taken to the clinic and stopped her ace inhibitor. She had 2 additional episodes since stopping ace inhibitor. One episode was after licking peanut butter and second episode was about 1-2 weeks ago and not sure what she had to eat that night.   Patient drinks a lot of decaffeinated tea with sweet and low sweetener. That is the only thing she consistently had during these 4 episodes. She has been drinking decaf tea now with regular sugar.   Dietary History: patient has been eating other foods including milk, eggs, shellfish, seafood, wheat, meats, fruits and vegetables.  Never had this problem before. Denies any associated symptoms, respiratory compromise or triggers. Denies any fevers, chills, changes in medications, foods, personal care products or recent infections. She has tried the following therapies: benadryl with some benefit. Systemic steroids no.  Currently on no antihistamines.  Previous work up includes: none. Patient is up to date with the following cancer screening tests: mammogram, pap smears.   Assessment and Plan: Eran is a 73 y.o. female with: Angio-edema 4 episodes of facial angioedema with no respiratory distress. No specific triggers noted. Took benadryl with good benefit. Used to be on ace inhibitor which was stopped but still had 2 episodes afterwards.  Today's skin testing showed: Mildly positive to dust mites and negative to foods.  Discussed with patient that her dust mite allergy is most likely not triggering these events and also unlikely to be triggered by foods.   Get bloodwork to rule out other etiologies.   Avoid the following potential triggers: alcohol, tight clothing, NSAIDs.   For mild symptoms you can take over the counter antihistamines such as Benadryl and monitor symptoms closely. If symptoms worsen or if you have severe symptoms including breathing issues, throat closure, significant swelling, whole body hives, severe diarrhea and vomiting, lightheadedness then seek immediate medical care.  Start taking zyrtec 10mg  daily at night.  Monitor symptoms.  Continue to avoid ace inhibitors.   House dust mite allergy Denies any rhino conjunctivitis symptoms. Today's skin testing positive to dust mites.  Discussed environmental control measures.   Return in about 2 months (around 07/03/2019).  Meds ordered this encounter  Medications  . cetirizine (ZYRTEC) 10 MG tablet    Sig: Take 1 tablet (10 mg total) by mouth daily.    Dispense:  90 tablet    Refill:  0    Lab Orders  CBC with Differential/Platelet     Complement component c1q     Comprehensive metabolic panel     ANA w/Reflex     C1 Esterase Inhibitor     C1 esterase inhibitor, functional     C3 and C4     Alpha-Gal Panel     Tryptase     Protein electrophoresis, serum     Protein Electrophoresis, Urine Rflx.     Allergens  w/Total IgE Area 2  Other allergy screening: Asthma: no Rhino conjunctivitis: no Food allergy: no Medication allergy: yes  Quinapril - swelling? Phenobarbital - rash Hymenoptera allergy: no Urticaria: no Eczema:no History of recurrent infections suggestive of immunodeficency: no  Diagnostics: Skin Testing: Environmental allergy panel and select foods. Positive test to: dust mite. Negative test to: foods.  Results discussed with patient/family. Airborne Adult Perc - 05/03/19 1417    Time Antigen Placed  1417    Allergen Manufacturer  Lavella Hammock    Location  Back    Number of Test  59    Panel 1  Select    1. Control-Buffer 50% Glycerol  Negative    2. Control-Histamine 1 mg/ml  2+    3. Albumin saline  Negative    4. [place]  Negative    5. [place]  Negative    6. Johnson  Negative    7. Westland Blue  Negative    8. Meadow Fescue  Negative    9. Perennial Rye  Negative    10. Sweet Vernal  Negative    11. Timothy  Negative    12. Cocklebur  Negative    13. Burweed Marshelder  Negative    14. Ragweed, short  Negative    15. Ragweed, Giant  Negative    16. Plantain,  English  Negative    17. Lamb's Quarters  Negative    18. Sheep Sorrell  Negative    19. Rough Pigweed  Negative    20. Marsh Elder, Rough  Negative    21. Mugwort, Common  Negative    22. Ash mix  Negative    23. Birch mix  Negative    24. Beech American  Negative    25. Box, Elder  Negative    26. Cedar, red  Negative    27. Cottonwood, [place]  Negative    28. Elm mix  Negative    29. Hickory mix  Negative    30. Maple mix  Negative    31. Oak, [place] mix  Negative    32. Pecan Pollen  Negative    33. Pine mix  Negative    34. Sycamore Eastern  Negative    35. Whispering Pines, Black Pollen  Negative    36. Alternaria alternata  Negative    37. Cladosporium Herbarum  Negative    38. Aspergillus mix  Negative    39. Penicillium mix  Negative    40. Bipolaris sorokiniana (Helminthosporium)  Negative    41.  Drechslera spicifera (Curvularia)  Negative    42. Mucor plumbeus  Negative    43. Fusarium moniliforme  Negative    44. Aureobasidium pullulans (pullulara)  Negative    45. Rhizopus oryzae  Negative    46. Botrytis cinera  Negative    47. Epicoccum nigrum  Negative    48. Phoma betae  Negative    49. Candida Albicans  Negative    50. Trichophyton mentagrophytes  Negative    51. Mite, D Farinae  5,000 AU/ml  2+  52. Mite, D Pteronyssinus  5,000 AU/ml  Negative    53. Cat Hair 10,000 BAU/ml  Negative    54.  Dog Epithelia  Negative    55. Mixed Feathers  Negative    56. Horse Epithelia  Negative    57. Cockroach, German  Negative    58. Mouse  Negative    59. Tobacco Leaf  Negative     Food Adult Perc - 05/03/19 1400    Time Antigen Placed  1418    Allergen Manufacturer  Lavella Hammock    Location  Back    Number of allergen test  22    Panel 2  Select    Control-Histamine 1 mg/ml  2+    1. Peanut  Negative    2. Soybean  Negative    3. Wheat  Negative    4. Sesame  Negative    5. Milk, cow  Negative    6. Egg White, Chicken  Negative    7. Casein  Negative    8. Shellfish Mix  Negative    9. Fish Mix  Negative    10. Cashew  Negative    11. Pecan Food  Negative    12. Cache  Negative    13. Almond  Negative    14. Hazelnut  Negative    15. [place] nut  Negative    16. Coconut  Negative    17. Pistachio  Negative    34. Rice  Negative    40. Beef  Negative    49. Onion  Negative    51. Carrots  Negative       Past Medical History: Patient Active Problem List   Diagnosis Date Noted  . Angio-edema 05/03/2019  . House dust mite allergy 05/03/2019   Past Medical History:  Diagnosis Date  . Angio-edema 05/03/2019  . Hyperlipemia   . Hypertension   . Hypothyroidism    Past Surgical History: Past Surgical History:  Procedure Laterality Date  . ABDOMINAL HYSTERECTOMY    . CESAREAN SECTION    . CHOLECYSTECTOMY    . COLONOSCOPY    . CYSTOCELE REPAIR    .  PAROTIDECTOMY  11/20/2012   Procedure: PAROTIDECTOMY;  Surgeon: Izora Gala, MD;  Location: [facility];  Service: ENT;  Laterality: Right;  . RECTOCELE REPAIR    . TONSILLECTOMY    . UPPER GI ENDOSCOPY     Medication List:  Current Outpatient Medications  Medication Sig Dispense Refill  . amLODipine (NORVASC) 5 MG tablet TK 1 T PO QD    . aspirin 81 MG tablet Take 81 mg by mouth daily.    . calcium carbonate (OS-CAL) 600 MG TABS Take 600 mg by mouth 2 (two) times daily with a meal.    . Coenzyme Q10 (CO Q-10) 200 MG CAPS Take by mouth.    . cyanocobalamin 1000 MCG tablet Take 1,000 mcg by mouth daily.    . fish oil-omega-3 fatty acids 1000 MG capsule Take 2 g by mouth daily.    . hydrochlorothiazide (HYDRODIURIL) 25 MG tablet Take 25 mg by mouth daily.    Marland Kitchen levothyroxine (SYNTHROID, LEVOTHROID) 50 MCG tablet Take 50 mcg by mouth daily.    . simvastatin (ZOCOR) 20 MG tablet Take 20 mg by mouth every evening.    . cetirizine (ZYRTEC) 10 MG tablet Take 1 tablet (10 mg total) by mouth daily. 90 tablet 0   No current facility-administered medications for this visit.  Allergies: Allergies  Allergen Reactions  . Quinapril Swelling  . Phenobarbital Rash   Social History: Social History   Socioeconomic History  . Marital status: Married    Spouse name: Not on file  . Number of children: Not on file  . Years of education: Not on file  . Highest education level: Not on file  Occupational History  . Not on file  Social Needs  . Financial resource strain: Not on file  . Food insecurity    Worry: Not on file    Inability: Not on file  . Transportation needs    Medical: Not on file    Non-medical: Not on file  Tobacco Use  . Smoking status: Former Smoker    Quit date: 11/15/1973    Years since quitting: 45.4  . Smokeless tobacco: Never Used  Substance and Sexual Activity  . Alcohol use: No  . Drug use: No  . Sexual activity: Not on file  Lifestyle  . Physical  activity    Days per week: Not on file    Minutes per session: Not on file  . Stress: Not on file  Relationships  . Social Herbalist on phone: Not on file    Gets together: Not on file    Attends religious service: Not on file    Active member of club or organization: Not on file    Attends meetings of clubs or organizations: Not on file    Relationship status: Not on file  Other Topics Concern  . Not on file  Social History Narrative  . Not on file   Lives in a 75 year old home. Smoking: denies Occupation: retired  Programme researcher, broadcasting/film/video HistoryFreight forwarder in the house: no Charity fundraiser in the family room: yes Carpet in the bedroom: yes Heating: electric Cooling: central Pet: no  Family History: History reviewed. No pertinent family history. Problem                               Relation Asthma                                   No  Eczema                                No  Food allergy                          Daughter  Allergic rhino conjunctivitis     Daughter  Angioedema    No  Review of Systems  Constitutional: Negative for appetite change, chills, fever and unexpected weight change.  HENT: Negative for congestion and rhinorrhea.   Eyes: Negative for itching.  Respiratory: Negative for cough, chest tightness, shortness of breath and wheezing.   Cardiovascular: Negative for chest pain.  Gastrointestinal: Negative for abdominal pain.  Genitourinary: Negative for difficulty urinating.  Skin: Negative for rash.  Allergic/Immunologic: Positive for environmental allergies. Negative for food allergies.  Neurological: Negative for headaches.   Objective: BP (!) 172/78 (BP Location: Left Arm, Patient Position: Sitting) Comment: rechecked by Lisabeth Pick CMA  Pulse 71   Temp 98.7 F (37.1 C)   Resp 14   Ht 5' 3.5" (1.613 m)   Wt 211 lb (95.7 kg)  SpO2 95%   BMI 36.79 kg/m  Body mass index is 36.79 kg/m. Physical Exam  Constitutional: She is oriented to  person, place, and time. She appears well-developed and well-nourished.  HENT:  Head: Normocephalic and atraumatic.  Right Ear: External ear normal.  Left Ear: External ear normal.  Nose: Nose normal.  Mouth/Throat: Oropharynx is clear and moist.  Eyes: Conjunctivae and EOM are normal.  Neck: Neck supple.  Cardiovascular: Normal rate, regular rhythm and normal heart sounds. Exam reveals no gallop and no friction rub.  No murmur heard. Pulmonary/Chest: Effort normal and breath sounds normal. She has no wheezes. She has no rales.  Abdominal: Soft.  Lymphadenopathy:    She has no cervical adenopathy.  Neurological: She is alert and oriented to person, place, and time.  Skin: Skin is warm. No rash noted.  Psychiatric: She has a normal mood and affect. Her behavior is normal.  Nursing note and vitals reviewed.  The plan was reviewed with the patient/family, and all questions/concerned were addressed.  It was my pleasure to see Joby today and participate in her care. Please feel free to contact me with any questions or concerns.  Sincerely,  Rexene Alberts, DO Allergy & Immunology  [facility] office: [phone] [facility] office: [phone]

## 2019-05-03 NOTE — Patient Instructions (Addendum)
Today's skin testing showed: Mildly positive to dust mites and negative to foods.  Get bloodwork   Avoid the following potential triggers: alcohol, tight clothing, NSAIDs.   For mild symptoms you can take over the counter antihistamines such as Benadryl and monitor symptoms closely. If symptoms worsen or if you have severe symptoms including breathing issues, throat closure, significant swelling, whole body hives, severe diarrhea and vomiting, lightheadedness then seek immediate medical care.  Start taking zyrtec 10mg  daily at night. Monitor symptoms.  Follow up in 2 months  Control of House Dust Mite Allergen . Dust mite allergens are a common trigger of allergy and asthma symptoms. While they can be found throughout the house, these microscopic creatures thrive in warm, humid environments such as bedding, upholstered furniture and carpeting. . Because so much time is spent in the bedroom, it is essential to reduce mite levels there.  . Encase pillows, mattresses, and box springs in special allergen-proof fabric covers or airtight, zippered plastic covers.  . Bedding should be washed weekly in hot water (130 F) and dried in a hot dryer. Allergen-proof covers are available for comforters and pillows that can't be regularly washed.  Wendee Copp the allergy-proof covers every few months. Minimize clutter in the bedroom. Keep pets out of the bedroom.  Marland Kitchen Keep humidity less than 50% by using a dehumidifier or air conditioning. You can buy a humidity measuring device called a hygrometer to monitor this.  . If possible, replace carpets with hardwood, linoleum, or washable area rugs. If that's not possible, vacuum frequently with a vacuum that has a HEPA filter. . Remove all upholstered furniture and non-washable window drapes from the bedroom. . Remove all non-washable stuffed toys from the bedroom.  Wash stuffed toys weekly.

## 2019-05-03 NOTE — Assessment & Plan Note (Signed)
Denies any rhino conjunctivitis symptoms. Today's skin testing positive to dust mites.  Discussed environmental control measures.

## 2019-05-03 NOTE — Assessment & Plan Note (Addendum)
4 episodes of facial angioedema with no respiratory distress. No specific triggers noted. Took benadryl with good benefit. Used to be on ace inhibitor which was stopped but still had 2 episodes afterwards.  Today's skin testing showed: Mildly positive to dust mites and negative to foods.  Discussed with patient that her dust mite allergy is most likely not triggering these events and also unlikely to be triggered by foods.   Get bloodwork to rule out other etiologies.   Avoid the following potential triggers: alcohol, tight clothing, NSAIDs.   For mild symptoms you can take over the counter antihistamines such as Benadryl and monitor symptoms closely. If symptoms worsen or if you have severe symptoms including breathing issues, throat closure, significant swelling, whole body hives, severe diarrhea and vomiting, lightheadedness then seek immediate medical care.  Start taking zyrtec 10mg  daily at night.  Monitor symptoms.  Continue to avoid ace inhibitors.

## 2019-05-06 LAB — PROTEIN ELECTROPHORESIS, URINE REFLEX

## 2019-05-06 LAB — PROTEIN ELECTROPHORESIS, SERUM

## 2019-05-14 ENCOUNTER — Telehealth: Payer: Self-pay | Admitting: *Deleted

## 2019-05-14 LAB — C3 AND C4
Complement C3, Serum: 165 mg/dL (ref 82–167)
Complement C4, Serum: 34 mg/dL (ref 14–44)

## 2019-05-14 LAB — ALPHA-GAL PANEL
Alpha Gal IgE*: <0.1 kU/L (ref <0.10)
Beef (Bos spp) IgE: <0.1 kU/L (ref <0.35)
Class Interpretation: 0
Class Interpretation: 0
Class Interpretation: 0
Lamb/Mutton (Ovis spp) IgE: <0.1 kU/L (ref <0.35)
Pork (Sus spp) IgE: <0.1 kU/L (ref <0.35)

## 2019-05-14 LAB — ALLERGENS W/TOTAL IGE AREA 2
Alternaria Alternata IgE: <0.1 kU/L
Aspergillus Fumigatus IgE: <0.1 kU/L
Bermuda Grass IgE: <0.1 kU/L
Cat Dander IgE: <0.1 kU/L
Cedar, Mountain IgE: <0.1 kU/L
Cladosporium Herbarum IgE: <0.1 kU/L
Cockroach, German IgE: 0.3 kU/L — AB
Common Silver Birch IgE: <0.1 kU/L
Cottonwood IgE: <0.1 kU/L
D Farinae IgE: 0.11 kU/L — AB
D Pteronyssinus IgE: 0.13 kU/L — AB
Dog Dander IgE: <0.1 kU/L
Elm, American IgE: <0.1 kU/L
IgE (Immunoglobulin E), Serum: 69 IU/mL (ref 6–495)
Johnson Grass IgE: <0.1 kU/L
Maple/Box Elder IgE: <0.1 kU/L
Mouse Urine IgE: <0.1 kU/L
Oak, White IgE: <0.1 kU/L
Pecan, Hickory IgE: <0.1 kU/L
Penicillium Chrysogen IgE: <0.1 kU/L
Pigweed, Rough IgE: <0.1 kU/L
Ragweed, Short IgE: <0.1 kU/L
Sheep Sorrel IgE Qn: <0.1 kU/L
Timothy Grass IgE: <0.1 kU/L
White Mulberry IgE: <0.1 kU/L

## 2019-05-14 LAB — COMPREHENSIVE METABOLIC PANEL
ALT: 25 IU/L (ref 0–32)
AST: 25 IU/L (ref 0–40)
Albumin/Globulin Ratio: 2 (ref 1.2–2.2)
Albumin: 4.4 g/dL (ref 3.7–4.7)
Alkaline Phosphatase: 58 IU/L (ref 39–117)
BUN/Creatinine Ratio: 21 (ref 12–28)
BUN: 10 mg/dL (ref 8–27)
Bilirubin Total: 0.4 mg/dL (ref 0.0–1.2)
CO2: 24 mmol/L (ref 20–29)
Calcium: 9.5 mg/dL (ref 8.7–10.3)
Chloride: 102 mmol/L (ref 96–106)
Creatinine, Ser: 0.48 mg/dL — ABNORMAL LOW (ref 0.57–1.00)
GFR calc Af Amer: 111 mL/min/{1.73_m2} (ref >59)
GFR calc non Af Amer: 96 mL/min/{1.73_m2} (ref >59)
Globulin, Total: 2.2 g/dL (ref 1.5–4.5)
Glucose: 93 mg/dL (ref 65–99)
Potassium: 3.9 mmol/L (ref 3.5–5.2)
Sodium: 141 mmol/L (ref 134–144)
Total Protein: 6.6 g/dL (ref 6.0–8.5)

## 2019-05-14 LAB — PROTEIN ELECTROPHORESIS, URINE REFLEX
Albumin ELP, Urine: 100 %
Alpha-1-Globulin, U: 0 %
Alpha-2-Globulin, U: 0 %
Beta Globulin, U: 0 %
Gamma Globulin, U: 0 %
Protein, Ur: 5.9 mg/dL

## 2019-05-14 LAB — CBC WITH DIFFERENTIAL/PLATELET
Basophils Absolute: 0 10*3/uL (ref 0.0–0.2)
Basos: 0 %
EOS (ABSOLUTE): 0.2 10*3/uL (ref 0.0–0.4)
Eos: 2 %
Hematocrit: 41.6 % (ref 34.0–46.6)
Hemoglobin: 13.7 g/dL (ref 11.1–15.9)
Immature Grans (Abs): 0 10*3/uL (ref 0.0–0.1)
Immature Granulocytes: 0 %
Lymphocytes Absolute: 2.4 10*3/uL (ref 0.7–3.1)
Lymphs: 30 %
MCH: 30.3 pg (ref 26.6–33.0)
MCHC: 32.9 g/dL (ref 31.5–35.7)
MCV: 92 fL (ref 79–97)
Monocytes Absolute: 0.6 10*3/uL (ref 0.1–0.9)
Monocytes: 7 %
Neutrophils Absolute: 4.8 10*3/uL (ref 1.4–7.0)
Neutrophils: 61 %
Platelets: 156 10*3/uL (ref 150–450)
RBC: 4.52 x10E6/uL (ref 3.77–5.28)
RDW: 12.7 % (ref 11.7–15.4)
WBC: 8 10*3/uL (ref 3.4–10.8)

## 2019-05-14 LAB — PROTEIN ELECTROPHORESIS, SERUM
A/G Ratio: 1.2 (ref 0.7–1.7)
Albumin ELP: 3.6 g/dL (ref 2.9–4.4)
Alpha 1: 0.2 g/dL (ref 0.0–0.4)
Alpha 2: 1 g/dL (ref 0.4–1.0)
Beta: 1.1 g/dL (ref 0.7–1.3)
Gamma Globulin: 0.7 g/dL (ref 0.4–1.8)
Globulin, Total: 3 g/dL (ref 2.2–3.9)

## 2019-05-14 LAB — TRYPTASE: Tryptase: 3.9 ug/L (ref 2.2–13.2)

## 2019-05-14 LAB — COMPLEMENT COMPONENT C1Q: Complement C1Q: 18 mg/dL (ref 10.3–20.5)

## 2019-05-14 LAB — C1 ESTERASE INHIBITOR: C1INH SerPl-mCnc: 28 mg/dL (ref 21–39)

## 2019-05-14 LAB — C1 ESTERASE INHIBITOR, FUNCTIONAL: C1INH Functional/C1INH Total MFr SerPl: 86 %mean normal

## 2019-05-14 LAB — ANA W/REFLEX: Anti Nuclear Antibody (ANA): NEGATIVE

## 2019-05-14 NOTE — Telephone Encounter (Signed)
Patient called wanting to be informed of lab results. Informed patient that they have not been reviewed by Dr. Maudie Mercury yet but once they have been reviewed we will call back to go over results.

## 2019-05-14 NOTE — Telephone Encounter (Signed)
Copied from result note.  Please call back patient.  I'm still waiting for 1 bloodwork - can you check with [facility] where the results are for complement 1 Q?   Blood count, kidney function, liver function, electrolytes, autoimmune screener, angioedema bloodwork, alpha gal, urine, tryptase (which is elevated in allergic reactions) were all normal.  One additional allergen was found which is cockroaches.   Has she had any other episodes of swelling?

## 2019-05-16 ENCOUNTER — Telehealth: Payer: Self-pay | Admitting: *Deleted

## 2019-05-16 ENCOUNTER — Ambulatory Visit (INDEPENDENT_AMBULATORY_CARE_PROVIDER_SITE_OTHER): Payer: Medicare Other | Admitting: Allergy

## 2019-05-16 ENCOUNTER — Encounter: Payer: Self-pay | Admitting: Allergy

## 2019-05-16 ENCOUNTER — Other Ambulatory Visit: Payer: Self-pay

## 2019-05-16 VITALS — BP 152/70 | HR 92 | Temp 98.9°F | Resp 16 | Ht 63.0 in | Wt 209.8 lb

## 2019-05-16 DIAGNOSIS — T783XXD Angioneurotic edema, subsequent encounter: Secondary | ICD-10-CM | POA: Diagnosis not present

## 2019-05-16 DIAGNOSIS — Z9109 Other allergy status, other than to drugs and biological substances: Secondary | ICD-10-CM

## 2019-05-16 NOTE — Assessment & Plan Note (Signed)
Past history - 4 episodes of facial angioedema with no respiratory distress since May. No triggers noted. Took benadryl with good benefit. Used to be on ace inhibitor which was stopped but still had 2 episodes afterwards. 2020 skin testing mildly positive to dust mites and negative to foods. Up to date with proper cancer screening exams.  Interim history - 2020 bloodwork unremarkable, reviewed results with patient. Woke up with another episode this morning but much less severe than prior episodes. Denies eating anything new. Symptoms much better now.  Not sure what may have triggered this episode of swelling but the zyrtec and benadryl seem to be helping the severity.   Continue zyrtec 10mg  at night.  May take benadryl 25mg  to 50mg  every 6 hours during an episode.  Keep track of episodes and what foods were consumed the day before and the day of.   No dietary changes at this time.   Avoid the following potential triggers: alcohol, tight clothing, NSAIDs.   Continue to avoid ace inhibitors.

## 2019-05-16 NOTE — Patient Instructions (Signed)
Not sure what may have triggered this episode of swelling but the zyrtec and benadryl seem to be helping. Continue zyrtec 10mg  at night. May take benadryl 25mg  to 50mg  every 6 hours during an episode. Write down what you had to eat like you did this time.  Take benadryl 25mg  tonight before going to bed.  Keep August appointment for now.

## 2019-05-16 NOTE — Progress Notes (Signed)
Follow Up Note  RE: Tammie Hammond MRN: [id number] DOB: 1946/09/26 Date of Office Visit: 05/16/2019  Referring provider: [name], MD Primary care provider: [name], MD  Chief Complaint: Angioedema (tongue)  History of Present Illness: I had the pleasure of seeing Tammie Hammond for a follow up visit at the [facility] [place] on 05/16/2019. She is a 73 y.o. female, who is being followed for angioedema and dust mite allergy. Today she is here for new complaint of another episode of angioedema. Her previous allergy office visit was on 05/03/2019 with Dr. Maudie Mercury.   Patient woke up today with tongue angioedema.   Last night patient bit her tongue while eating dinner around 6:30PM. She had a salad which contained - carrots, iceberg lettuce, blueberries, cucumber, celery, cauliflower, tomato, cheddar cheese and thousand island dressing. She also had a burger patty, dill pickle, oranges, peppers and zero sprite.   In there afternoon she had hot chocolate and right before bed she had 4 pieces of Hershey's miniature chocolates - dark, regular, krackle and Mr. Donell Sievert.  For lunch she had [place] stick, cheese stick and potato chips with sprite. For breakfast she had coffee with hazelnut creamer, grilled cheese sandwich and fresh pineapples.   All these foods she had yesterday are foods that she eats on a consistent basis.  She did take her zyrtec 10mg  at night and 12.5mg  of benadryl before going to bed and this morning she took benadryl 25mg .  She did noticed that these swelling episodes tend to occur every 3-4 weeks however this episode was lot less severe than previous ones.  She did not have any lip swelling with this or any trouble breathing. Currently she feels like her tongue is slightly swollen but much better than this morning.   Reviewed bloodwork with her as well - all unremarkable.   Assessment and Plan: Tammie Hammond is a 73 y.o. female with: Angio-edema Past history  - 4 episodes of facial angioedema with no respiratory distress since May. No triggers noted. Took benadryl with good benefit. Used to be on ace inhibitor which was stopped but still had 2 episodes afterwards. 2020 skin testing mildly positive to dust mites and negative to foods. Up to date with proper cancer screening exams.  Interim history - 2020 bloodwork unremarkable, reviewed results with patient. Woke up with another episode this morning but much less severe than prior episodes. Denies eating anything new. Symptoms much better now.  Not sure what may have triggered this episode of swelling but the zyrtec and benadryl seem to be helping the severity.   Continue zyrtec 10mg  at night.  May take benadryl 25mg  to 50mg  every 6 hours during an episode.  Keep track of episodes and what foods were consumed the day before and the day of.   No dietary changes at this time.   Avoid the following potential triggers: alcohol, tight clothing, NSAIDs.   Continue to avoid ace inhibitors.   Return in about 2 months (around 07/17/2019).  Diagnostics: None.  Medication List:  Current Outpatient Medications  Medication Sig Dispense Refill  . amLODipine (NORVASC) 5 MG tablet TK 1 T PO QD    . aspirin 81 MG tablet Take 81 mg by mouth daily.    . calcium carbonate (OS-CAL) 600 MG TABS Take 600 mg by mouth 2 (two) times daily with a meal.    . cetirizine (ZYRTEC) 10 MG tablet Take 1 tablet (10 mg total) by mouth daily. 90 tablet  0  . Coenzyme Q10 (CO Q-10) 200 MG CAPS Take by mouth.    . cyanocobalamin 1000 MCG tablet Take 1,000 mcg by mouth daily.    . fish oil-omega-3 fatty acids 1000 MG capsule Take 2 g by mouth daily.    . hydrochlorothiazide (HYDRODIURIL) 25 MG tablet Take 25 mg by mouth daily.    Marland Kitchen levothyroxine (SYNTHROID, LEVOTHROID) 50 MCG tablet Take 50 mcg by mouth daily.    . simvastatin (ZOCOR) 20 MG tablet Take 20 mg by mouth every evening.     No current facility-administered  medications for this visit.    Allergies: Allergies  Allergen Reactions  . Quinapril Swelling  . Phenobarbital Rash   I reviewed her past medical history, social history, family history, and environmental history and no significant changes have been reported from previous visit on 05/03/2019.  Review of Systems  Constitutional: Negative for appetite change, chills, fever and unexpected weight change.  HENT: Negative for congestion, rhinorrhea and trouble swallowing.        Tongue swelling  Eyes: Negative for itching.  Respiratory: Negative for cough, chest tightness, shortness of breath and wheezing.   Cardiovascular: Negative for chest pain.  Gastrointestinal: Negative for abdominal pain.  Genitourinary: Negative for difficulty urinating.  Skin: Negative for rash.  Allergic/Immunologic: Positive for environmental allergies. Negative for food allergies.  Neurological: Negative for headaches.   Objective: BP (!) 152/70 (BP Location: Left Arm, Patient Position: Sitting, Cuff Size: Large)   Pulse 92   Temp 98.9 F (37.2 C) (Temporal)   Resp 16   Ht 5\' 3"  (1.6 m)   Wt 209 lb 12.8 oz (95.2 kg)   SpO2 98%   BMI 37.16 kg/m  Body mass index is 37.16 kg/m. Physical Exam  Constitutional: She is oriented to person, place, and time. She appears well-developed and well-nourished.  HENT:  Head: Normocephalic and atraumatic.  Nose: Nose normal.  Mouth/Throat: Oropharynx is clear and moist.  Left side of the tongue slightly larger than right side. Clear speech.  Eyes: Conjunctivae and EOM are normal.  Neck: Neck supple.  Cardiovascular: Normal rate, regular rhythm and normal heart sounds. Exam reveals no gallop and no friction rub.  No murmur heard. Pulmonary/Chest: Effort normal and breath sounds normal. She has no wheezes. She has no rales.  Abdominal: Soft.  Lymphadenopathy:    She has no cervical adenopathy.  Neurological: She is alert and oriented to person, place, and time.   Skin: Skin is warm. No rash noted.  Psychiatric: She has a normal mood and affect. Her behavior is normal.  Nursing note and vitals reviewed.  Previous notes and tests were reviewed. The plan was reviewed with the patient/family, and all questions/concerned were addressed.  It was my pleasure to see Tammie Hammond today and participate in her care. Please feel free to contact me with any questions or concerns.  Sincerely,  Rexene Alberts, DO Allergy & Immunology  [facility] office: [phone] [facility] Main office: [phone]

## 2019-05-16 NOTE — Telephone Encounter (Signed)
Called patient and put her on the schedule today to be seen.

## 2019-05-16 NOTE — Telephone Encounter (Signed)
Have patient come in if she is able to.

## 2019-05-16 NOTE — Telephone Encounter (Signed)
Patient woke up in the AM C/O tongue was swollen. Patient had a spot on the left side looked it had been bitten and then this AM was swollen this AM. Patient had a toss salad ( iceberg lettuce, cucumbers, tomato, celery, cheese, coliflower, and thousand island dressing), along with hamburger (meat only) with ketchup and dill pickle on the side. She drank a diet sprite with her meal. Around lunch time she had some cheese sticks with [place] jerky, with a diet sprite. And in the morning patient had a grilled cheese sandwich, coffee with creamer and fresh pineapple for breakfast. Patient was able to take benadryl 25 mg this am to control swelling and is taking her current regimen with a half of 12.5 benadryl in the evening of first sign of swelling. No C/o of SOB, wheezing, throat swelling, facial swelling, tingling or numbness. Patient would like to know if she needs to be seen or to continue current regimen.

## 2019-05-16 NOTE — Telephone Encounter (Signed)
Patient just wanted to call and let you know that when she woke up this morning, her tongue was and still is very swollen. No other symptoms. She is going to take the zyrtec and Benadryl as directed but wanted to know if she should come in to be seen. She would like a call back.

## 2019-06-08 DIAGNOSIS — M8589 Other specified disorders of bone density and structure, multiple sites: Secondary | ICD-10-CM | POA: Diagnosis not present

## 2019-06-08 DIAGNOSIS — Z1231 Encounter for screening mammogram for malignant neoplasm of breast: Secondary | ICD-10-CM | POA: Diagnosis not present

## 2019-06-08 DIAGNOSIS — Z90710 Acquired absence of both cervix and uterus: Secondary | ICD-10-CM | POA: Diagnosis not present

## 2019-06-08 DIAGNOSIS — Z780 Asymptomatic menopausal state: Secondary | ICD-10-CM | POA: Diagnosis not present

## 2019-06-08 DIAGNOSIS — R29890 Loss of height: Secondary | ICD-10-CM | POA: Diagnosis not present

## 2019-06-15 DIAGNOSIS — H52223 Regular astigmatism, bilateral: Secondary | ICD-10-CM | POA: Diagnosis not present

## 2019-06-15 DIAGNOSIS — H524 Presbyopia: Secondary | ICD-10-CM | POA: Diagnosis not present

## 2019-06-15 DIAGNOSIS — H5203 Hypermetropia, bilateral: Secondary | ICD-10-CM | POA: Diagnosis not present

## 2019-06-15 DIAGNOSIS — H259 Unspecified age-related cataract: Secondary | ICD-10-CM | POA: Diagnosis not present

## 2019-06-28 DIAGNOSIS — I10 Essential (primary) hypertension: Secondary | ICD-10-CM | POA: Diagnosis not present

## 2019-06-28 DIAGNOSIS — R7303 Prediabetes: Secondary | ICD-10-CM | POA: Diagnosis not present

## 2019-06-28 DIAGNOSIS — E039 Hypothyroidism, unspecified: Secondary | ICD-10-CM | POA: Diagnosis not present

## 2019-06-28 DIAGNOSIS — E7800 Pure hypercholesterolemia, unspecified: Secondary | ICD-10-CM | POA: Diagnosis not present

## 2019-06-29 DIAGNOSIS — E7800 Pure hypercholesterolemia, unspecified: Secondary | ICD-10-CM | POA: Diagnosis not present

## 2019-06-29 DIAGNOSIS — R7303 Prediabetes: Secondary | ICD-10-CM | POA: Diagnosis not present

## 2019-06-29 DIAGNOSIS — I10 Essential (primary) hypertension: Secondary | ICD-10-CM | POA: Diagnosis not present

## 2019-07-04 ENCOUNTER — Ambulatory Visit (INDEPENDENT_AMBULATORY_CARE_PROVIDER_SITE_OTHER): Payer: Medicare Other | Admitting: Allergy

## 2019-07-04 ENCOUNTER — Encounter: Payer: Self-pay | Admitting: Allergy

## 2019-07-04 ENCOUNTER — Other Ambulatory Visit: Payer: Self-pay

## 2019-07-04 VITALS — BP 172/80 | HR 76 | Temp 98.0°F | Resp 18

## 2019-07-04 DIAGNOSIS — T783XXD Angioneurotic edema, subsequent encounter: Secondary | ICD-10-CM | POA: Diagnosis not present

## 2019-07-04 MED ORDER — CETIRIZINE HCL 10 MG PO TABS: 10.0000 mg | ORAL_TABLET | Freq: Every day | ORAL | 2 refills | Status: AC

## 2019-07-04 NOTE — Patient Instructions (Addendum)
Angio-edema  Continue zyrtec 10mg  at night.  May take benadryl 25mg  to 50mg  every 6 hours during an episode.  Keep track of episodes and what foods were consumed the day before and the day of.   No dietary changes at this time.   Avoid the following potential triggers: alcohol, tight clothing, NSAIDs.   Continue to avoid ace inhibitors.   Follow up in 3 months or sooner if needed.

## 2019-07-04 NOTE — Assessment & Plan Note (Addendum)
Past history - 4 episodes of facial angioedema with no respiratory distress since May. No triggers noted. Took benadryl with good benefit. Used to be on ace inhibitor which was stopped but still had 2 episodes afterwards. 2020 skin testing mildly positive to dust mites and negative to foods. Up to date with proper cancer screening exams. 2020 bloodwork unremarkable. Interim history - had 1 left sided tongue swelling in the past 2 months. Resolved after benadryl. No triggers noted. Frequency and severity has definitely decreased since initial evaluation. Most likely idiopathic in nature.   Continue zyrtec 10mg  at night.  May take benadryl 25mg  to 50mg  every 6 hours during an episode.  Keep track of episodes and what foods were consumed the day before and the day of.   No dietary changes at this time.   Avoid the following potential triggers: alcohol, tight clothing, NSAIDs.   Continue to avoid ace inhibitors.

## 2019-07-04 NOTE — Progress Notes (Signed)
Follow Up Note  RE: Tammie Hammond MRN: [id number] DOB: 1946-03-26 Date of Office Visit: 07/04/2019  Referring provider: [name], MD Primary care provider: [name], MD  Chief Complaint: Angioedema  History of Present Illness: I had the pleasure of seeing Tammie Hammond for a follow up visit at the [facility] [place] on 07/04/2019. She is a 73 y.o. female, who is being followed for angioedema. Today she is here for regular follow up visit. Her previous allergy office visit was on 05/16/2019 with Dr. Maudie Mercury.   Angio-edema Patient had 1 episode of left sided tongue swelling and 1 minor episode left sided lip swelling. The tongue swelling occurred at 1:57AM. She took 2 benadryl and took 1 more benadryl at 9AM. Th tongue swelling went down a few hours afterwards. No trouble breathing.  The day before, she had coffee with hazelnut sweetener for breakfast. She had pecans and walnuts with cinnamon, maple syrup and tomatoes in the afternoon and lemonade, leftover spaghetti with meatballs for dinner. Right before bed around 9PM she had a few potato chips.  Patient had all these foods since then with no issues.  Takes zyrtec 10mg  daily at night.   Assessment and Plan: Tammie Hammond is a 73 y.o. female with: Angio-edema Past history - 4 episodes of facial angioedema with no respiratory distress since May. No triggers noted. Took benadryl with good benefit. Used to be on ace inhibitor which was stopped but still had 2 episodes afterwards. 2020 skin testing mildly positive to dust mites and negative to foods. Up to date with proper cancer screening exams. 2020 bloodwork unremarkable. Interim history - had 1 left sided tongue swelling in the past 2 months. Resolved after benadryl. No triggers noted. Frequency and severity has definitely decreased since initial evaluation. Most likely idiopathic in nature.   Continue zyrtec 10mg  at night.  May take benadryl 25mg  to 50mg  every 6 hours  during an episode.  Keep track of episodes and what foods were consumed the day before and the day of.   No dietary changes at this time.   Avoid the following potential triggers: alcohol, tight clothing, NSAIDs.   Continue to avoid ace inhibitors.    Return in about 3 months (around 10/04/2019).  Meds ordered this encounter  Medications  . cetirizine (ZYRTEC) 10 MG tablet    Sig: Take 1 tablet (10 mg total) by mouth daily.    Dispense:  90 tablet    Refill:  2   Diagnostics: None.  Medication List:  Current Outpatient Medications  Medication Sig Dispense Refill  . amLODipine (NORVASC) 5 MG tablet TK 1 T PO QD    . aspirin 81 MG tablet Take 81 mg by mouth daily.    . calcium carbonate (OS-CAL) 600 MG TABS Take 600 mg by mouth 2 (two) times daily with a meal.    . cetirizine (ZYRTEC) 10 MG tablet Take 1 tablet (10 mg total) by mouth daily. 90 tablet 2  . Coenzyme Q10 (CO Q-10) 200 MG CAPS Take by mouth.    . cyanocobalamin 1000 MCG tablet Take 1,000 mcg by mouth daily.    . fish oil-omega-3 fatty acids 1000 MG capsule Take 2 g by mouth daily.    . hydrochlorothiazide (HYDRODIURIL) 25 MG tablet Take 25 mg by mouth daily.    Marland Kitchen levothyroxine (SYNTHROID, LEVOTHROID) 50 MCG tablet Take 50 mcg by mouth daily.    . simvastatin (ZOCOR) 20 MG tablet Take 20 mg by mouth  every evening.     No current facility-administered medications for this visit.    Allergies: Allergies  Allergen Reactions  . Quinapril Swelling  . Phenobarbital Rash   I reviewed her past medical history, social history, family history, and environmental history and no significant changes have been reported from previous visit on 05/16/2019.  Review of Systems  Constitutional: Negative for appetite change, chills, fever and unexpected weight change.  HENT: Negative for congestion, rhinorrhea and trouble swallowing.   Eyes: Negative for itching.  Respiratory: Negative for cough, chest tightness, shortness of  breath and wheezing.   Cardiovascular: Negative for chest pain.  Gastrointestinal: Negative for abdominal pain.  Genitourinary: Negative for difficulty urinating.  Skin: Negative for rash.  Allergic/Immunologic: Positive for environmental allergies. Negative for food allergies.  Neurological: Negative for headaches.   Objective: BP (!) 160/82 (BP Location: Left Arm, Patient Position: Sitting, Cuff Size: Normal)   Pulse 76   Temp 98 F (36.7 C) (Temporal)   Resp 18   SpO2 96%  There is no height or weight on file to calculate BMI. Physical Exam  Constitutional: She is oriented to person, place, and time. She appears well-developed and well-nourished.  HENT:  Head: Normocephalic and atraumatic.  Nose: Nose normal.  Mouth/Throat: Oropharynx is clear and moist.  Eyes: Conjunctivae and EOM are normal.  Neck: Neck supple.  Cardiovascular: Normal rate, regular rhythm and normal heart sounds. Exam reveals no gallop and no friction rub.  No murmur heard. Pulmonary/Chest: Effort normal and breath sounds normal. She has no wheezes. She has no rales.  Abdominal: Soft.  Neurological: She is alert and oriented to person, place, and time.  Skin: Skin is warm. No rash noted.  Psychiatric: She has a normal mood and affect. Her behavior is normal.  Nursing note and vitals reviewed.  Previous notes and tests were reviewed. The plan was reviewed with the patient/family, and all questions/concerned were addressed.  It was my pleasure to see [name] today and participate in her care. Please feel free to contact me with any questions or concerns.  Sincerely,  Rexene Alberts, DO Allergy & Immunology  [facility] office: [phone] [facility] office: [place] office: [phone]

## 2019-07-24 DIAGNOSIS — Z23 Encounter for immunization: Secondary | ICD-10-CM | POA: Diagnosis not present

## 2019-08-06 DIAGNOSIS — H25811 Combined forms of age-related cataract, right eye: Secondary | ICD-10-CM | POA: Diagnosis not present

## 2019-08-06 DIAGNOSIS — H25812 Combined forms of age-related cataract, left eye: Secondary | ICD-10-CM | POA: Diagnosis not present

## 2019-08-29 DIAGNOSIS — H25811 Combined forms of age-related cataract, right eye: Secondary | ICD-10-CM | POA: Diagnosis not present

## 2019-08-29 DIAGNOSIS — Z01818 Encounter for other preprocedural examination: Secondary | ICD-10-CM | POA: Diagnosis not present

## 2019-08-29 DIAGNOSIS — H25812 Combined forms of age-related cataract, left eye: Secondary | ICD-10-CM | POA: Diagnosis not present

## 2019-09-13 DIAGNOSIS — H25812 Combined forms of age-related cataract, left eye: Secondary | ICD-10-CM | POA: Diagnosis not present

## 2019-09-13 DIAGNOSIS — H2512 Age-related nuclear cataract, left eye: Secondary | ICD-10-CM | POA: Diagnosis not present

## 2019-09-27 DIAGNOSIS — H25811 Combined forms of age-related cataract, right eye: Secondary | ICD-10-CM | POA: Diagnosis not present

## 2019-09-27 DIAGNOSIS — H2511 Age-related nuclear cataract, right eye: Secondary | ICD-10-CM | POA: Diagnosis not present

## 2019-10-08 ENCOUNTER — Ambulatory Visit: Payer: Medicare Other | Admitting: Allergy

## 2019-12-25 DIAGNOSIS — E7800 Pure hypercholesterolemia, unspecified: Secondary | ICD-10-CM | POA: Diagnosis not present

## 2019-12-25 DIAGNOSIS — E039 Hypothyroidism, unspecified: Secondary | ICD-10-CM | POA: Diagnosis not present

## 2019-12-25 DIAGNOSIS — I10 Essential (primary) hypertension: Secondary | ICD-10-CM | POA: Diagnosis not present

## 2019-12-25 DIAGNOSIS — R7303 Prediabetes: Secondary | ICD-10-CM | POA: Diagnosis not present

## 2020-06-23 DIAGNOSIS — I10 Essential (primary) hypertension: Secondary | ICD-10-CM | POA: Diagnosis not present

## 2020-06-23 DIAGNOSIS — E7800 Pure hypercholesterolemia, unspecified: Secondary | ICD-10-CM | POA: Diagnosis not present

## 2020-06-23 DIAGNOSIS — Z1211 Encounter for screening for malignant neoplasm of colon: Secondary | ICD-10-CM | POA: Diagnosis not present

## 2020-06-23 DIAGNOSIS — E039 Hypothyroidism, unspecified: Secondary | ICD-10-CM | POA: Diagnosis not present

## 2020-06-23 DIAGNOSIS — R7309 Other abnormal glucose: Secondary | ICD-10-CM | POA: Diagnosis not present

## 2020-06-23 DIAGNOSIS — D696 Thrombocytopenia, unspecified: Secondary | ICD-10-CM | POA: Diagnosis not present

## 2020-06-23 DIAGNOSIS — R7303 Prediabetes: Secondary | ICD-10-CM | POA: Diagnosis not present

## 2020-06-23 DIAGNOSIS — Z0000 Encounter for general adult medical examination without abnormal findings: Secondary | ICD-10-CM | POA: Diagnosis not present

## 2020-07-02 DIAGNOSIS — Z1211 Encounter for screening for malignant neoplasm of colon: Secondary | ICD-10-CM | POA: Diagnosis not present

## 2020-08-05 DIAGNOSIS — Z23 Encounter for immunization: Secondary | ICD-10-CM | POA: Diagnosis not present

## 2020-12-29 DIAGNOSIS — I10 Essential (primary) hypertension: Secondary | ICD-10-CM | POA: Diagnosis not present

## 2020-12-29 DIAGNOSIS — R7303 Prediabetes: Secondary | ICD-10-CM | POA: Diagnosis not present

## 2020-12-29 DIAGNOSIS — E039 Hypothyroidism, unspecified: Secondary | ICD-10-CM | POA: Diagnosis not present

## 2020-12-29 DIAGNOSIS — E7800 Pure hypercholesterolemia, unspecified: Secondary | ICD-10-CM | POA: Diagnosis not present

## 2020-12-29 DIAGNOSIS — N3001 Acute cystitis with hematuria: Secondary | ICD-10-CM | POA: Diagnosis not present

## 2020-12-29 DIAGNOSIS — Z0000 Encounter for general adult medical examination without abnormal findings: Secondary | ICD-10-CM | POA: Diagnosis not present

## 2021-06-15 DIAGNOSIS — M85852 Other specified disorders of bone density and structure, left thigh: Secondary | ICD-10-CM | POA: Diagnosis not present

## 2021-06-15 DIAGNOSIS — M85851 Other specified disorders of bone density and structure, right thigh: Secondary | ICD-10-CM | POA: Diagnosis not present

## 2021-06-15 DIAGNOSIS — Z1231 Encounter for screening mammogram for malignant neoplasm of breast: Secondary | ICD-10-CM | POA: Diagnosis not present

## 2021-06-29 DIAGNOSIS — I10 Essential (primary) hypertension: Secondary | ICD-10-CM | POA: Diagnosis not present

## 2021-06-29 DIAGNOSIS — D696 Thrombocytopenia, unspecified: Secondary | ICD-10-CM | POA: Diagnosis not present

## 2021-06-29 DIAGNOSIS — R7303 Prediabetes: Secondary | ICD-10-CM | POA: Diagnosis not present

## 2021-06-29 DIAGNOSIS — M85851 Other specified disorders of bone density and structure, right thigh: Secondary | ICD-10-CM | POA: Diagnosis not present

## 2021-06-29 DIAGNOSIS — K1370 Unspecified lesions of oral mucosa: Secondary | ICD-10-CM | POA: Diagnosis not present

## 2021-06-29 DIAGNOSIS — E039 Hypothyroidism, unspecified: Secondary | ICD-10-CM | POA: Diagnosis not present

## 2021-06-29 DIAGNOSIS — E7800 Pure hypercholesterolemia, unspecified: Secondary | ICD-10-CM | POA: Diagnosis not present

## 2021-07-02 DIAGNOSIS — W010XXA Fall on same level from slipping, tripping and stumbling without subsequent striking against object, initial encounter: Secondary | ICD-10-CM | POA: Diagnosis not present

## 2021-07-02 DIAGNOSIS — M25521 Pain in right elbow: Secondary | ICD-10-CM | POA: Diagnosis not present

## 2021-07-02 DIAGNOSIS — S52111A Torus fracture of upper end of right radius, initial encounter for closed fracture: Secondary | ICD-10-CM | POA: Diagnosis not present

## 2021-07-02 DIAGNOSIS — R0781 Pleurodynia: Secondary | ICD-10-CM | POA: Diagnosis not present

## 2021-07-03 DIAGNOSIS — M25521 Pain in right elbow: Secondary | ICD-10-CM | POA: Diagnosis not present

## 2021-07-03 DIAGNOSIS — S52134A Nondisplaced fracture of neck of right radius, initial encounter for closed fracture: Secondary | ICD-10-CM | POA: Diagnosis not present

## 2021-07-08 DIAGNOSIS — S52134A Nondisplaced fracture of neck of right radius, initial encounter for closed fracture: Secondary | ICD-10-CM | POA: Diagnosis not present

## 2021-07-21 DIAGNOSIS — S52134A Nondisplaced fracture of neck of right radius, initial encounter for closed fracture: Secondary | ICD-10-CM | POA: Diagnosis not present

## 2021-07-29 DIAGNOSIS — K136 Irritative hyperplasia of oral mucosa: Secondary | ICD-10-CM | POA: Diagnosis not present

## 2021-08-11 DIAGNOSIS — K136 Irritative hyperplasia of oral mucosa: Secondary | ICD-10-CM | POA: Diagnosis not present

## 2021-08-12 ENCOUNTER — Emergency Department (HOSPITAL_COMMUNITY): Payer: Medicare Other

## 2021-08-12 ENCOUNTER — Emergency Department (HOSPITAL_COMMUNITY)
Admission: EM | Admit: 2021-08-12 | Discharge: 2021-08-12 | Disposition: A | Discharge status: Home / Self Care | Payer: Medicare Other | Attending: Emergency Medicine | Admitting: Emergency Medicine

## 2021-08-12 ENCOUNTER — Encounter (HOSPITAL_COMMUNITY): Payer: Self-pay

## 2021-08-12 DIAGNOSIS — S59902A Unspecified injury of left elbow, initial encounter: Secondary | ICD-10-CM | POA: Diagnosis present

## 2021-08-12 DIAGNOSIS — W19XXXA Unspecified fall, initial encounter: Secondary | ICD-10-CM | POA: Diagnosis not present

## 2021-08-12 DIAGNOSIS — E039 Hypothyroidism, unspecified: Secondary | ICD-10-CM | POA: Insufficient documentation

## 2021-08-12 DIAGNOSIS — Z79899 Other long term (current) drug therapy: Secondary | ICD-10-CM | POA: Insufficient documentation

## 2021-08-12 DIAGNOSIS — S52122A Displaced fracture of head of left radius, initial encounter for closed fracture: Secondary | ICD-10-CM | POA: Diagnosis not present

## 2021-08-12 DIAGNOSIS — I10 Essential (primary) hypertension: Secondary | ICD-10-CM | POA: Diagnosis not present

## 2021-08-12 DIAGNOSIS — Z7982 Long term (current) use of aspirin: Secondary | ICD-10-CM | POA: Insufficient documentation

## 2021-08-12 DIAGNOSIS — S53115A Anterior dislocation of left ulnohumeral joint, initial encounter: Secondary | ICD-10-CM

## 2021-08-12 DIAGNOSIS — M25522 Pain in left elbow: Secondary | ICD-10-CM | POA: Insufficient documentation

## 2021-08-12 DIAGNOSIS — S53015A Anterior dislocation of left radial head, initial encounter: Secondary | ICD-10-CM | POA: Diagnosis not present

## 2021-08-12 DIAGNOSIS — W010XXA Fall on same level from slipping, tripping and stumbling without subsequent striking against object, initial encounter: Secondary | ICD-10-CM | POA: Diagnosis not present

## 2021-08-12 DIAGNOSIS — Z87891 Personal history of nicotine dependence: Secondary | ICD-10-CM | POA: Insufficient documentation

## 2021-08-12 DIAGNOSIS — R52 Pain, unspecified: Secondary | ICD-10-CM

## 2021-08-12 DIAGNOSIS — G8929 Other chronic pain: Secondary | ICD-10-CM | POA: Diagnosis not present

## 2021-08-12 DIAGNOSIS — S53105A Unspecified dislocation of left ulnohumeral joint, initial encounter: Secondary | ICD-10-CM | POA: Diagnosis not present

## 2021-08-12 LAB — CBC WITH DIFFERENTIAL/PLATELET
Abs Immature Granulocytes: 0.05 10*3/uL (ref 0.00–0.07)
Basophils Absolute: 0 10*3/uL (ref 0.0–0.1)
Basophils Relative: 0 %
Eosinophils Absolute: 0.1 10*3/uL (ref 0.0–0.5)
Eosinophils Relative: 1 %
HCT: 44.3 % (ref 36.0–46.0)
Hemoglobin: 14.8 g/dL (ref 12.0–15.0)
Immature Granulocytes: 1 %
Lymphocytes Relative: 19 %
Lymphs Abs: 2 10*3/uL (ref 0.7–4.0)
MCH: 31.3 pg (ref 26.0–34.0)
MCHC: 33.4 g/dL (ref 30.0–36.0)
MCV: 93.7 fL (ref 80.0–100.0)
Monocytes Absolute: 0.6 10*3/uL (ref 0.1–1.0)
Monocytes Relative: 6 %
Neutro Abs: 7.8 10*3/uL — ABNORMAL HIGH (ref 1.7–7.7)
Neutrophils Relative %: 73 %
Platelets: 150 10*3/uL (ref 150–400)
RBC: 4.73 MIL/uL (ref 3.87–5.11)
RDW: 12.9 % (ref 11.5–15.5)
WBC: 10.6 10*3/uL — ABNORMAL HIGH (ref 4.0–10.5)
nRBC: 0 % (ref 0.0–0.2)

## 2021-08-12 LAB — COMPREHENSIVE METABOLIC PANEL
ALT: 37 U/L (ref 0–44)
AST: 55 U/L — ABNORMAL HIGH (ref 15–41)
Albumin: 4.1 g/dL (ref 3.5–5.0)
Alkaline Phosphatase: 91 U/L (ref 38–126)
Anion gap: 12 (ref 5–15)
BUN: 13 mg/dL (ref 8–23)
CO2: 26 mmol/L (ref 22–32)
Calcium: 9.7 mg/dL (ref 8.9–10.3)
Chloride: 107 mmol/L (ref 98–111)
Creatinine, Ser: 0.78 mg/dL (ref 0.44–1.00)
GFR, Estimated: >60 mL/min (ref >60)
Glucose, Bld: 127 mg/dL — ABNORMAL HIGH (ref 70–99)
Potassium: 4.2 mmol/L (ref 3.5–5.1)
Sodium: 145 mmol/L (ref 135–145)
Total Bilirubin: 1.2 mg/dL (ref 0.3–1.2)
Total Protein: 7.1 g/dL (ref 6.5–8.1)

## 2021-08-12 MED ORDER — OXYCODONE-ACETAMINOPHEN 5-325 MG PO TABS: 1.0000 | ORAL_TABLET | Freq: Four times a day (QID) | ORAL | 0 refills | Status: AC | PRN

## 2021-08-12 MED ORDER — HYDROMORPHONE HCL 1 MG/ML IJ SOLN
1.0000 mg | Freq: Once | INTRAMUSCULAR | Status: AC
  Administered 2021-08-12: 1 mg via INTRAVENOUS
  Filled 2021-08-12: qty 1

## 2021-08-12 MED ORDER — PROPOFOL 10 MG/ML IV BOLUS
0.5000 mg/kg | Freq: Once | INTRAVENOUS | Status: AC
  Administered 2021-08-12: 44.7 mg via INTRAVENOUS
  Filled 2021-08-12: qty 20

## 2021-08-12 NOTE — Sedation Documentation (Signed)
Vital signs stable. 

## 2021-08-12 NOTE — ED Triage Notes (Signed)
Pt presents to the ED via EMS from home following a fall at home, falling onto her letf elbow with obvious deformity. Pt states she "got tripped" when she was attempting to put on her pants. Pt c/o left elbow pain radiating to the left shoulder. Pt denies pain elsewhere throughout the body, did not hit her head, denies LOC and does not take blood thinners. Pt has a known hx of HTN, hypothyroid, and hyperlipidemia as well as "pre-diabetes."

## 2021-08-12 NOTE — ED Notes (Signed)
Tammie Hammond

## 2021-08-12 NOTE — ED Provider Notes (Signed)
[facility] DEPT Provider Note   CSN: [id number] Arrival date & time: 08/12/21  1107     History Chief Complaint  Patient presents with   left elbow injury    Tammie Hammond is a 75 y.o. female.  75 year old female presents had mechanical fall just prior to arrival.  Patient states that she tripped and fell onto her left elbow.  No LOC.  No head or neck discomfort.  Complains of severe sharp pain to her left elbow that is worse with any movement.  Denies any distal numbness or tingling to her left hand.  Denies any shoulder discomfort.  No chest or abdominal discomfort      Past Medical History:  Diagnosis Date   Angio-edema 05/03/2019   Hyperlipemia    Hypertension    Hypothyroidism     Patient Active Problem List   Diagnosis Date Noted   Angio-edema 05/03/2019   House dust mite allergy 05/03/2019    Past Surgical History:  Procedure Laterality Date   ABDOMINAL HYSTERECTOMY     CESAREAN SECTION     CHOLECYSTECTOMY     COLONOSCOPY     CYSTOCELE REPAIR     PAROTIDECTOMY  11/20/2012   Procedure: PAROTIDECTOMY;  Surgeon: Izora Gala, MD;  Location: [facility];  Service: ENT;  Laterality: Right;   RECTOCELE REPAIR     TONSILLECTOMY     UPPER GI ENDOSCOPY       OB History   No obstetric history on file.     History reviewed. No pertinent family history.  Social History   Tobacco Use   Smoking status: Former    Types: Cigarettes    Quit date: 11/15/1973    Years since quitting: 47.7   Smokeless tobacco: Never  Vaping Use   Vaping Use: Never used  Substance Use Topics   Alcohol use: No   Drug use: No    Home Medications Prior to Admission medications   Medication Sig Start Date End Date Taking? Authorizing Provider  amLODipine (NORVASC) 5 MG tablet TK 1 T PO QD 04/13/19   [provider]  aspirin 81 MG tablet Take 81 mg by mouth daily.    [provider]  calcium carbonate (OS-CAL) 600 MG TABS  Take 600 mg by mouth 2 (two) times daily with a meal.    [provider]  cetirizine (ZYRTEC) 10 MG tablet Take 1 tablet (10 mg total) by mouth daily. 07/04/19   Garnet Sierras, DO  Coenzyme Q10 (CO Q-10) 200 MG CAPS Take by mouth.    [provider]  cyanocobalamin 1000 MCG tablet Take 1,000 mcg by mouth daily.    [provider]  fish oil-omega-3 fatty acids 1000 MG capsule Take 2 g by mouth daily.    [provider]  hydrochlorothiazide (HYDRODIURIL) 25 MG tablet Take 25 mg by mouth daily.    [provider]  levothyroxine (SYNTHROID, LEVOTHROID) 50 MCG tablet Take 50 mcg by mouth daily.    [provider]  simvastatin (ZOCOR) 20 MG tablet Take 20 mg by mouth every evening.    [provider]    Allergies    Quinapril and Phenobarbital  Review of Systems   Review of Systems  All other systems reviewed and are negative.  Physical Exam Updated Vital Signs BP (!) 179/66 (BP Location: Right Arm)   Pulse 61   Temp 98.1 F (36.7 C) (Oral)   Resp 18   Ht 1.6  m (5\' 3" )   Wt 89.4 kg   SpO2 97%   BMI 34.90 kg/m   Physical Exam Vitals and nursing note reviewed.  Constitutional:      General: She is not in acute distress.    Appearance: Normal appearance. She is well-developed. She is not toxic-appearing.  HENT:     Head: Normocephalic and atraumatic.  Eyes:     General: Lids are normal.     Conjunctiva/sclera: Conjunctivae normal.     Pupils: Pupils are equal, round, and reactive to light.  Neck:     Thyroid: No thyroid mass.     Trachea: No tracheal deviation.  Cardiovascular:     Rate and Rhythm: Normal rate and regular rhythm.     Heart sounds: Normal heart sounds. No murmur heard.   No gallop.  Pulmonary:     Effort: Pulmonary effort is normal. No respiratory distress.     Breath sounds: Normal breath sounds. No stridor. No decreased breath sounds, wheezing, rhonchi or rales.  Abdominal:     General: There is  no distension.     Palpations: Abdomen is soft.     Tenderness: There is no abdominal tenderness. There is no rebound.  Musculoskeletal:        General: No tenderness.     Left elbow: Swelling and deformity present. Decreased range of motion.     Cervical back: Normal range of motion and neck supple.     Comments: Neurovascular status intact at left hand  Skin:    General: Skin is warm and dry.     Findings: No abrasion or rash.  Neurological:     Mental Status: She is alert and oriented to person, place, and time. Mental status is at baseline.     GCS: GCS eye subscore is 4. GCS verbal subscore is 5. GCS motor subscore is 6.     Cranial Nerves: Cranial nerves are intact. No cranial nerve deficit.     Sensory: No sensory deficit.     Motor: Motor function is intact.  Psychiatric:        Attention and Perception: Attention normal.        Speech: Speech normal.        Behavior: Behavior normal.    ED Results / Procedures / Treatments   Labs (all labs ordered are listed, but only abnormal results are displayed) Labs Reviewed  CBC WITH DIFFERENTIAL/PLATELET - Abnormal; Notable for the following components:      Result Value   WBC 10.6 (*)    Neutro Abs 7.8 (*)    All other components within normal limits  COMPREHENSIVE METABOLIC PANEL    EKG None  Radiology No results found.  Procedures Reduction of dislocation  Date/Time: 08/12/2021 3:27 PM Performed by: Lacretia Leigh, MD Authorized by: Lacretia Leigh, MD  Consent: Verbal consent obtained. Written consent obtained. Risks and benefits: risks, benefits and alternatives were discussed Consent given by: patient Required items: required blood products, implants, devices, and special equipment available Time out: Immediately prior to procedure a "time out" was called to verify the correct patient, procedure, equipment, support staff and site/side marked as required. Preparation: Patient was prepped and draped in the usual  sterile fashion.  Sedation: Patient sedated: yes Sedatives: propofol Sedation start date/time: 08/12/2021 3:15 PM Sedation end date/time: 08/12/2021 3:28 PM Vitals: Vital signs were monitored during sedation.  Patient tolerance: patient tolerated the procedure well with no immediate complications     Medications Ordered in ED Medications  HYDROmorphone (DILAUDID) injection 1 mg (has no administration in time range)    ED Course  I have reviewed the triage vital signs and the nursing notes.  Pertinent labs & imaging results that were available during my care of the patient were reviewed by me and considered in my medical decision making (see chart for details).    MDM Rules/Calculators/A&P                           Patient medicated for pain here x2. Patient's films results reviewed with Dr. Apolonio Schneiders and recommends patient have close reduction with splint and about 100 degrees of flexion.  Neurovascular status confirmed after post reduction films show good alignment  she will follow-up with the patient in the office. Final Clinical Impression(s) / ED Diagnoses Final diagnoses:  None    Rx / DC Orders ED Discharge Orders     None        Lacretia Leigh, MD 08/12/21 [phone]

## 2021-08-14 DIAGNOSIS — S53115A Anterior dislocation of left ulnohumeral joint, initial encounter: Secondary | ICD-10-CM | POA: Diagnosis not present

## 2021-08-19 DIAGNOSIS — S59192A Other physeal fracture of upper end of radius, left arm, initial encounter for closed fracture: Secondary | ICD-10-CM | POA: Diagnosis not present

## 2021-08-19 DIAGNOSIS — S53115A Anterior dislocation of left ulnohumeral joint, initial encounter: Secondary | ICD-10-CM | POA: Diagnosis not present

## 2021-08-19 DIAGNOSIS — S52092A Other fracture of upper end of left ulna, initial encounter for closed fracture: Secondary | ICD-10-CM | POA: Diagnosis not present

## 2021-08-19 DIAGNOSIS — Y999 Unspecified external cause status: Secondary | ICD-10-CM | POA: Diagnosis not present

## 2021-08-19 DIAGNOSIS — G8918 Other acute postprocedural pain: Secondary | ICD-10-CM | POA: Diagnosis not present

## 2021-08-19 DIAGNOSIS — S52022A Displaced fracture of olecranon process without intraarticular extension of left ulna, initial encounter for closed fracture: Secondary | ICD-10-CM | POA: Diagnosis not present

## 2021-08-19 DIAGNOSIS — X58XXXA Exposure to other specified factors, initial encounter: Secondary | ICD-10-CM | POA: Diagnosis not present

## 2021-08-19 DIAGNOSIS — S52122A Displaced fracture of head of left radius, initial encounter for closed fracture: Secondary | ICD-10-CM | POA: Diagnosis not present

## 2021-08-19 DIAGNOSIS — S5332XA Traumatic rupture of left ulnar collateral ligament, initial encounter: Secondary | ICD-10-CM | POA: Diagnosis not present

## 2021-08-19 DIAGNOSIS — S53105A Unspecified dislocation of left ulnohumeral joint, initial encounter: Secondary | ICD-10-CM | POA: Diagnosis not present

## 2021-09-02 DIAGNOSIS — Z23 Encounter for immunization: Secondary | ICD-10-CM | POA: Diagnosis not present

## 2021-09-10 DIAGNOSIS — S53115A Anterior dislocation of left ulnohumeral joint, initial encounter: Secondary | ICD-10-CM | POA: Diagnosis not present

## 2021-09-15 DIAGNOSIS — M25622 Stiffness of left elbow, not elsewhere classified: Secondary | ICD-10-CM | POA: Diagnosis not present

## 2021-09-24 DIAGNOSIS — M25522 Pain in left elbow: Secondary | ICD-10-CM | POA: Diagnosis not present

## 2021-09-30 DIAGNOSIS — M25522 Pain in left elbow: Secondary | ICD-10-CM | POA: Diagnosis not present

## 2021-09-30 DIAGNOSIS — Z4789 Encounter for other orthopedic aftercare: Secondary | ICD-10-CM | POA: Diagnosis not present

## 2021-09-30 DIAGNOSIS — S53115A Anterior dislocation of left ulnohumeral joint, initial encounter: Secondary | ICD-10-CM | POA: Diagnosis not present

## 2021-10-09 DIAGNOSIS — M25622 Stiffness of left elbow, not elsewhere classified: Secondary | ICD-10-CM | POA: Diagnosis not present

## 2021-10-16 DIAGNOSIS — M25522 Pain in left elbow: Secondary | ICD-10-CM | POA: Diagnosis not present

## 2021-10-30 DIAGNOSIS — M25622 Stiffness of left elbow, not elsewhere classified: Secondary | ICD-10-CM | POA: Diagnosis not present

## 2021-10-30 DIAGNOSIS — M25522 Pain in left elbow: Secondary | ICD-10-CM | POA: Diagnosis not present

## 2021-11-05 DIAGNOSIS — M25512 Pain in left shoulder: Secondary | ICD-10-CM | POA: Diagnosis not present

## 2021-11-05 DIAGNOSIS — S53115A Anterior dislocation of left ulnohumeral joint, initial encounter: Secondary | ICD-10-CM | POA: Diagnosis not present

## 2021-11-08 DIAGNOSIS — M79622 Pain in left upper arm: Secondary | ICD-10-CM | POA: Diagnosis not present

## 2021-11-08 DIAGNOSIS — M79652 Pain in left thigh: Secondary | ICD-10-CM | POA: Diagnosis not present

## 2021-11-08 DIAGNOSIS — S71112A Laceration without foreign body, left thigh, initial encounter: Secondary | ICD-10-CM | POA: Diagnosis not present

## 2021-11-08 DIAGNOSIS — W01198A Fall on same level from slipping, tripping and stumbling with subsequent striking against other object, initial encounter: Secondary | ICD-10-CM | POA: Diagnosis not present

## 2021-11-08 DIAGNOSIS — S0101XA Laceration without foreign body of scalp, initial encounter: Secondary | ICD-10-CM | POA: Diagnosis not present

## 2021-11-13 DIAGNOSIS — S53115A Anterior dislocation of left ulnohumeral joint, initial encounter: Secondary | ICD-10-CM | POA: Diagnosis not present

## 2021-12-04 DIAGNOSIS — S53115A Anterior dislocation of left ulnohumeral joint, initial encounter: Secondary | ICD-10-CM | POA: Diagnosis not present

## 2021-12-23 DIAGNOSIS — H52223 Regular astigmatism, bilateral: Secondary | ICD-10-CM | POA: Diagnosis not present

## 2021-12-23 DIAGNOSIS — H5213 Myopia, bilateral: Secondary | ICD-10-CM | POA: Diagnosis not present

## 2021-12-23 DIAGNOSIS — H524 Presbyopia: Secondary | ICD-10-CM | POA: Diagnosis not present

## 2021-12-23 DIAGNOSIS — Z961 Presence of intraocular lens: Secondary | ICD-10-CM | POA: Diagnosis not present

## 2021-12-23 DIAGNOSIS — Z135 Encounter for screening for eye and ear disorders: Secondary | ICD-10-CM | POA: Diagnosis not present

## 2021-12-25 DIAGNOSIS — Z20822 Contact with and (suspected) exposure to covid-19: Secondary | ICD-10-CM | POA: Diagnosis not present

## 2022-01-06 DIAGNOSIS — E039 Hypothyroidism, unspecified: Secondary | ICD-10-CM | POA: Diagnosis not present

## 2022-01-06 DIAGNOSIS — E7800 Pure hypercholesterolemia, unspecified: Secondary | ICD-10-CM | POA: Diagnosis not present

## 2022-01-06 DIAGNOSIS — M85851 Other specified disorders of bone density and structure, right thigh: Secondary | ICD-10-CM | POA: Diagnosis not present

## 2022-01-06 DIAGNOSIS — D696 Thrombocytopenia, unspecified: Secondary | ICD-10-CM | POA: Diagnosis not present

## 2022-01-06 DIAGNOSIS — I10 Essential (primary) hypertension: Secondary | ICD-10-CM | POA: Diagnosis not present

## 2022-01-06 DIAGNOSIS — R7303 Prediabetes: Secondary | ICD-10-CM | POA: Diagnosis not present

## 2022-02-02 DIAGNOSIS — S52134A Nondisplaced fracture of neck of right radius, initial encounter for closed fracture: Secondary | ICD-10-CM | POA: Diagnosis not present

## 2022-03-01 DIAGNOSIS — Z20822 Contact with and (suspected) exposure to covid-19: Secondary | ICD-10-CM | POA: Diagnosis not present

## 2022-06-21 DIAGNOSIS — Z1231 Encounter for screening mammogram for malignant neoplasm of breast: Secondary | ICD-10-CM | POA: Diagnosis not present

## 2022-07-19 DIAGNOSIS — Z0000 Encounter for general adult medical examination without abnormal findings: Secondary | ICD-10-CM | POA: Diagnosis not present

## 2022-07-19 DIAGNOSIS — R7303 Prediabetes: Secondary | ICD-10-CM | POA: Diagnosis not present

## 2022-07-19 DIAGNOSIS — D696 Thrombocytopenia, unspecified: Secondary | ICD-10-CM | POA: Diagnosis not present

## 2022-07-19 DIAGNOSIS — I10 Essential (primary) hypertension: Secondary | ICD-10-CM | POA: Diagnosis not present

## 2022-07-19 DIAGNOSIS — M85851 Other specified disorders of bone density and structure, right thigh: Secondary | ICD-10-CM | POA: Diagnosis not present

## 2022-07-19 DIAGNOSIS — E039 Hypothyroidism, unspecified: Secondary | ICD-10-CM | POA: Diagnosis not present

## 2022-07-19 DIAGNOSIS — J301 Allergic rhinitis due to pollen: Secondary | ICD-10-CM | POA: Diagnosis not present

## 2022-07-19 DIAGNOSIS — E7800 Pure hypercholesterolemia, unspecified: Secondary | ICD-10-CM | POA: Diagnosis not present

## 2022-08-03 DIAGNOSIS — Z23 Encounter for immunization: Secondary | ICD-10-CM | POA: Diagnosis not present

## 2022-08-19 DIAGNOSIS — S52135D Nondisplaced fracture of neck of left radius, subsequent encounter for closed fracture with routine healing: Secondary | ICD-10-CM | POA: Diagnosis not present

## 2022-08-31 DIAGNOSIS — M25561 Pain in right knee: Secondary | ICD-10-CM | POA: Diagnosis not present

## 2022-10-12 IMAGING — DX DG ELBOW 2V*L*
1 series · 1 of 1 positions shown · non-contrast
Comparison: 08/12/2021 at [DATE] a.m.

CLINICAL DATA: Post reduction images of the right elbow.

EXAM:
LEFT ELBOW - 2 VIEW

[elbow lat]
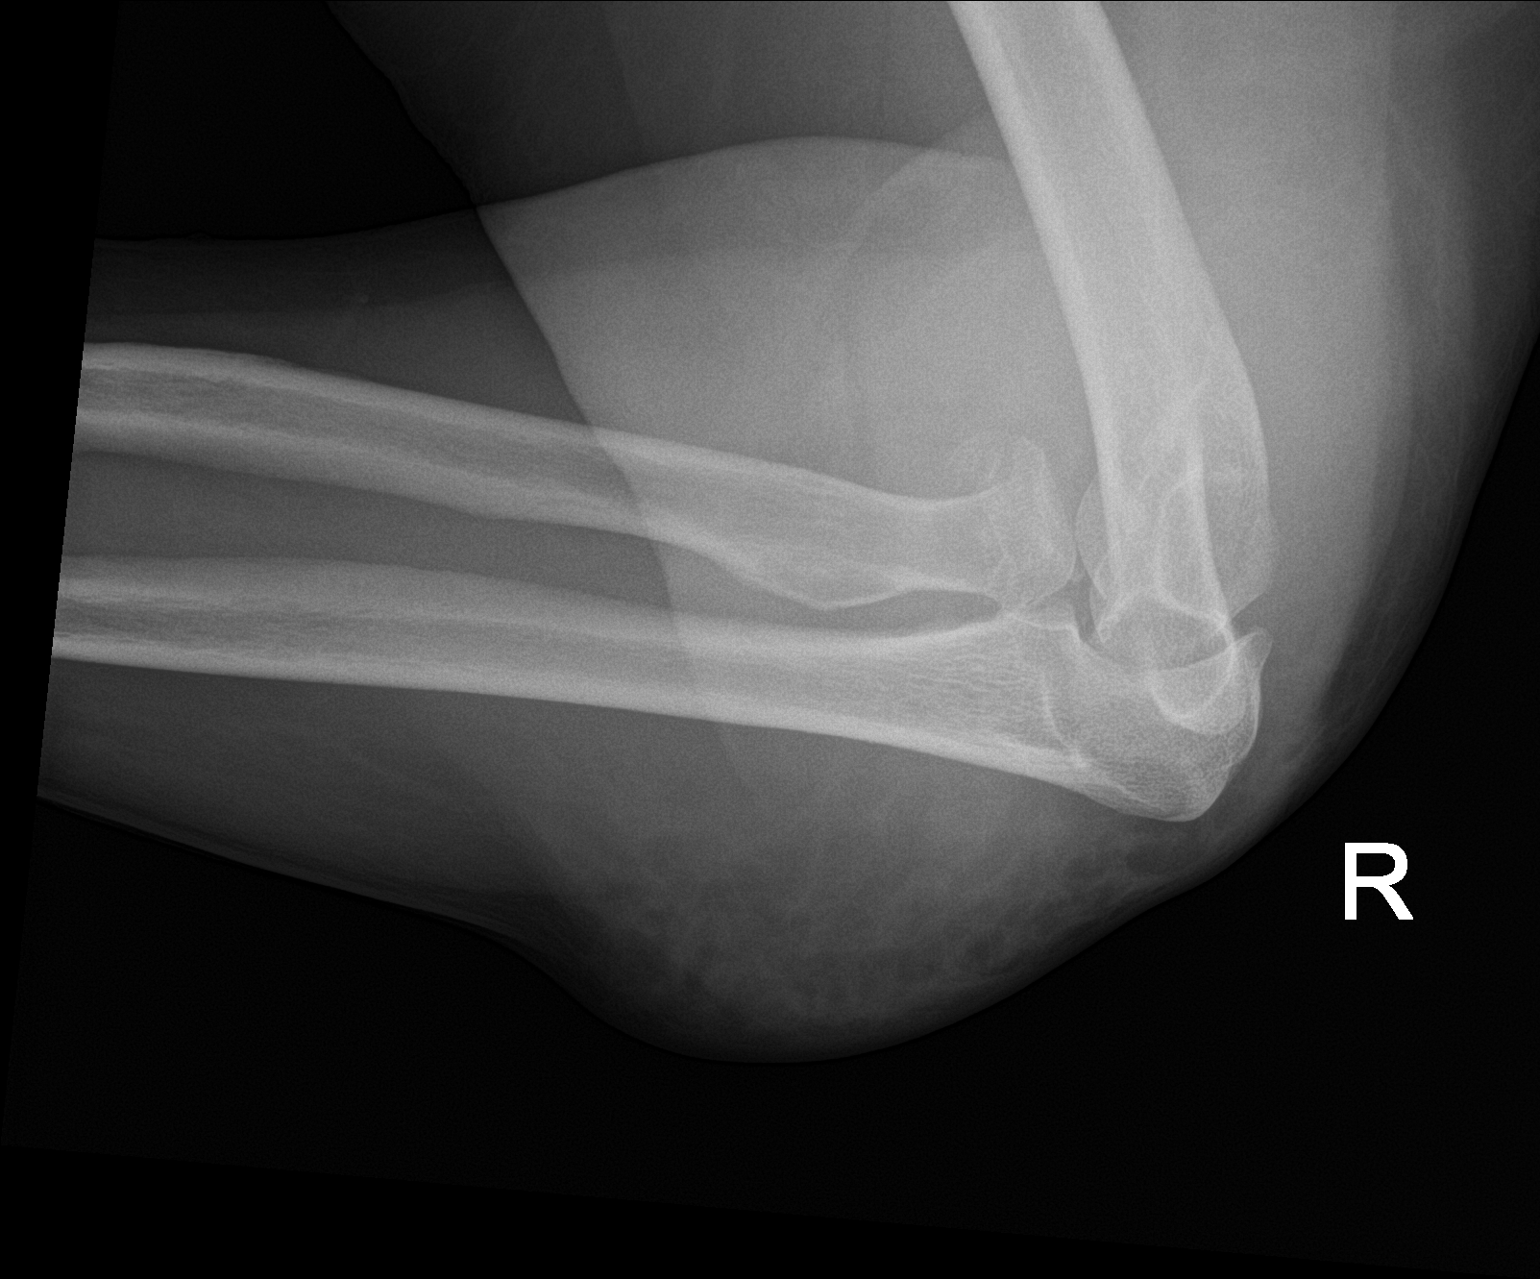

[1 of 1 positions shown; findings below may reference images not displayed]

FINDINGS: Single oblique lateral view of the right elbow shows reduction of
the dislocation, ulna and radius now both aligned with the trochlea
and capitellum respectively, based on this single view.

Radial head fractures are noted.

There is soft tissue swelling that is most evident dorsally.
IMPRESSION: Successful reduction of the dislocated right elbow.

## 2022-10-12 IMAGING — DX DG ELBOW 2V*L*
2 series · 2 of 2 positions shown · non-contrast
Comparison: None.

CLINICAL DATA: Fall.  Left elbow pain

EXAM:
LEFT ELBOW - 2 VIEW

[elbow lat]
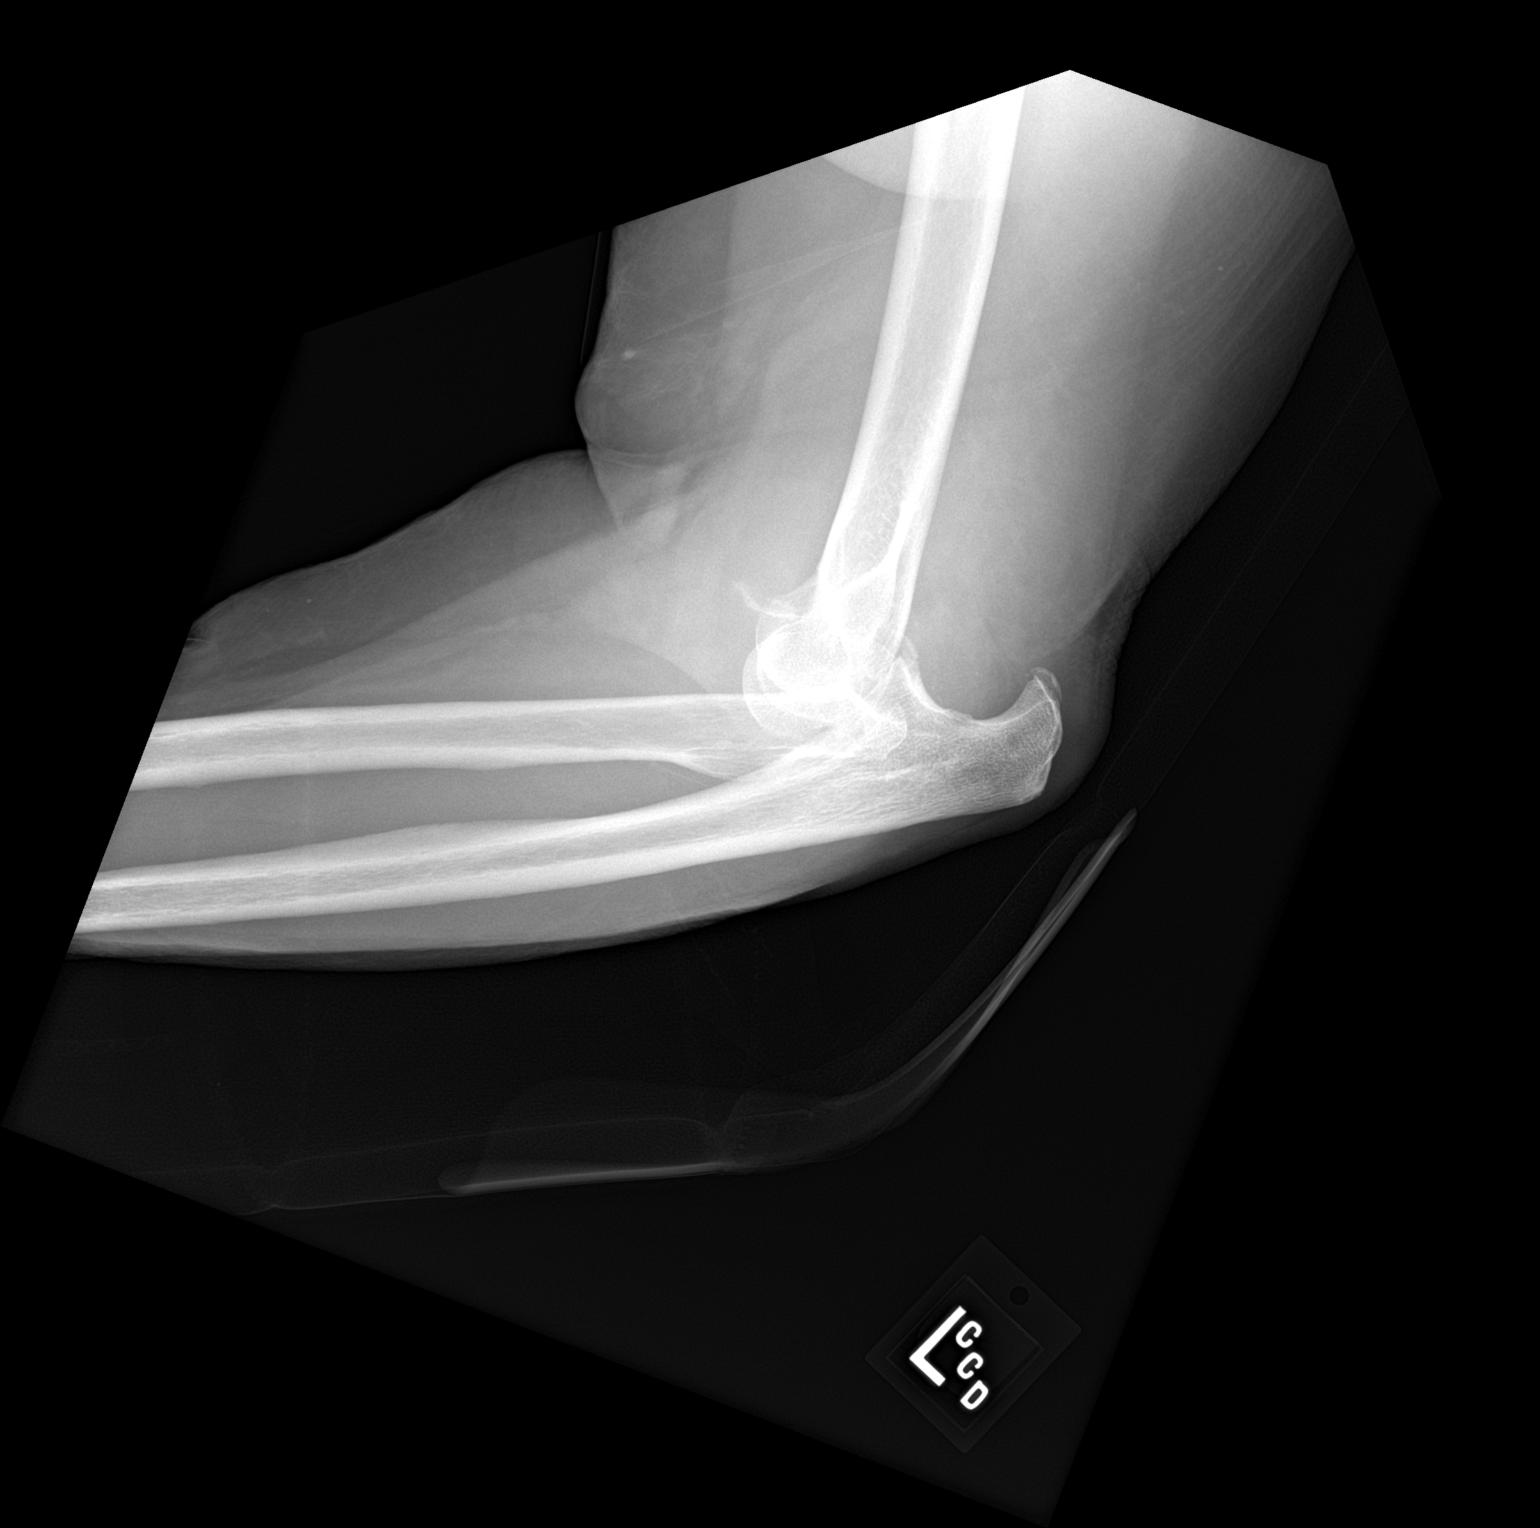

[elbow ap]
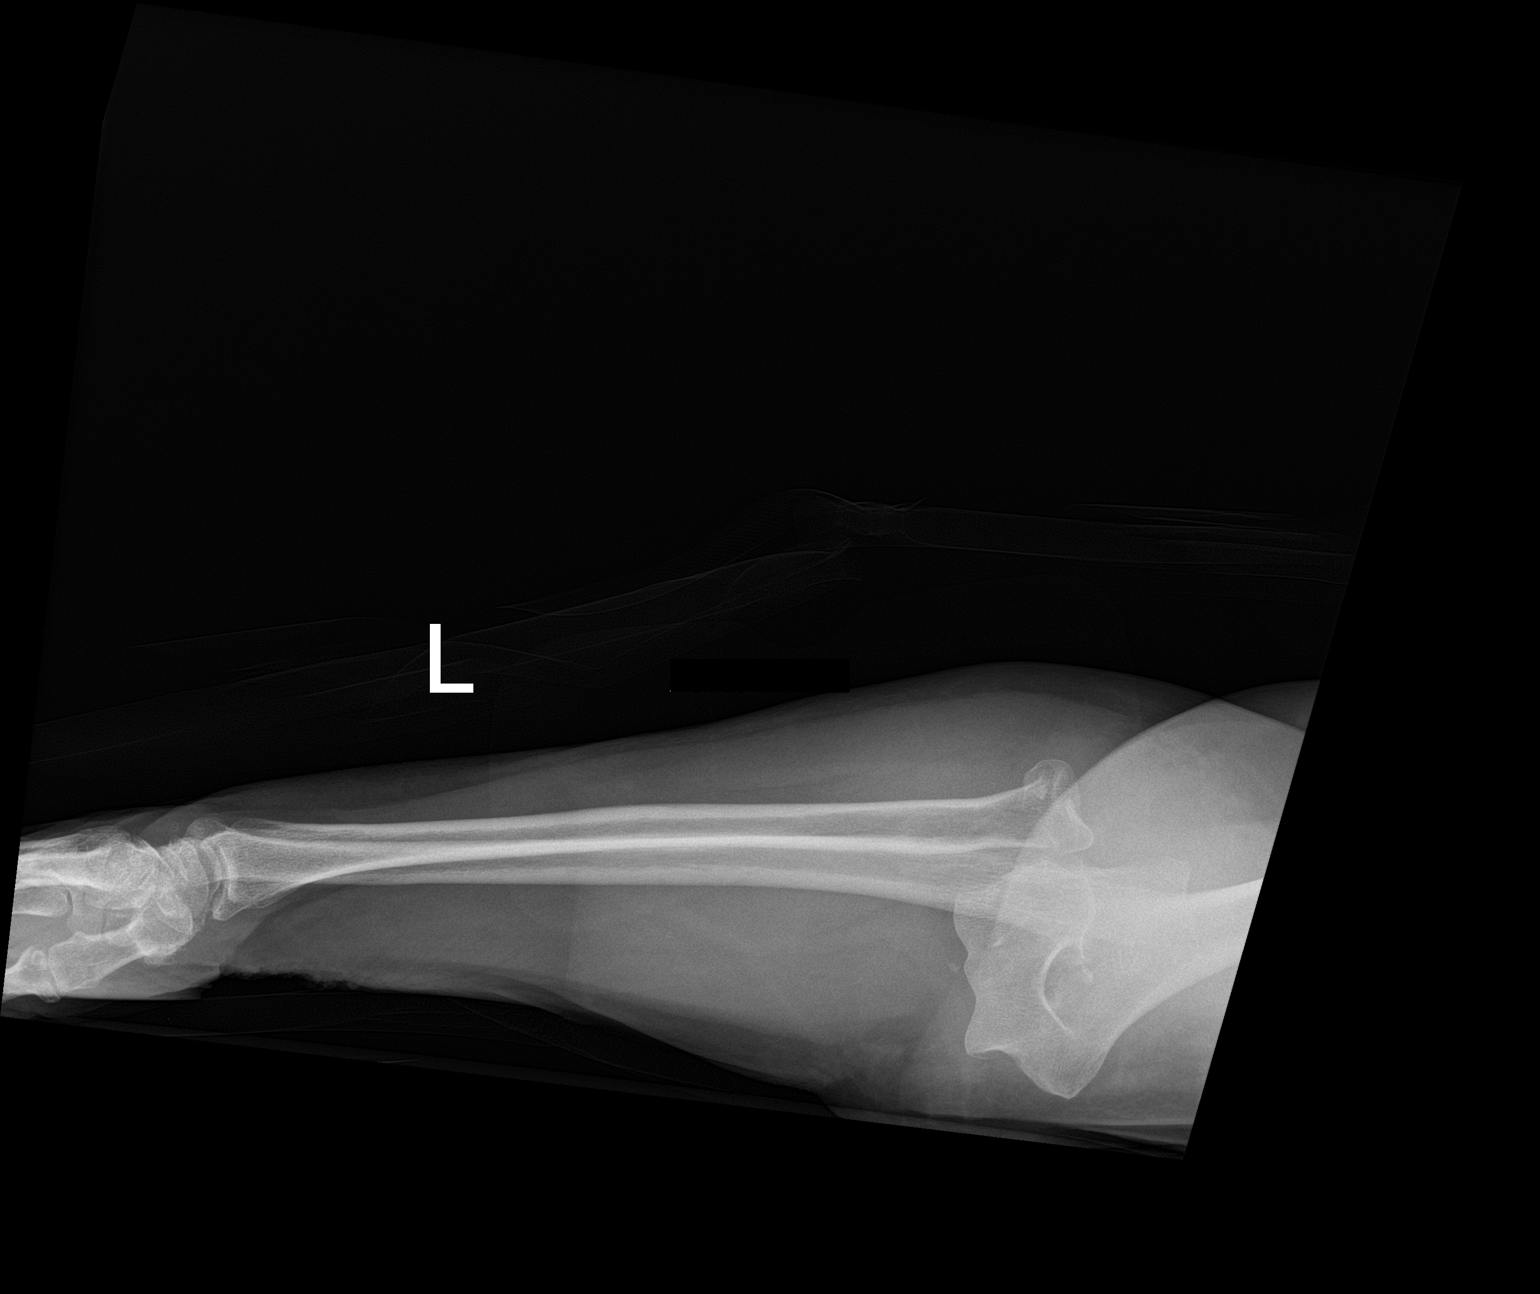

[2 of 2 positions shown; findings below may reference images not displayed]

FINDINGS: Fracture dislocation of the left elbow. The ulna and radius have
dislocated posteriorly, overlapping the posterior margin of the
distal humerus. There are also fractures. There is a fracture of the
radial head crossing the articular surface. Additional fracture
fragments are seen from the coronoid process of the ulna and
proximal tip of the olecranon.

There is a joint effusion and surrounding soft tissue edema.
IMPRESSION: 1. Fracture dislocation of the left elbow as detailed.

## 2023-01-25 DIAGNOSIS — E039 Hypothyroidism, unspecified: Secondary | ICD-10-CM | POA: Diagnosis not present

## 2023-01-25 DIAGNOSIS — E7800 Pure hypercholesterolemia, unspecified: Secondary | ICD-10-CM | POA: Diagnosis not present

## 2023-01-25 DIAGNOSIS — J301 Allergic rhinitis due to pollen: Secondary | ICD-10-CM | POA: Diagnosis not present

## 2023-01-25 DIAGNOSIS — R7303 Prediabetes: Secondary | ICD-10-CM | POA: Diagnosis not present

## 2023-01-25 DIAGNOSIS — I10 Essential (primary) hypertension: Secondary | ICD-10-CM | POA: Diagnosis not present

## 2023-07-29 DIAGNOSIS — E039 Hypothyroidism, unspecified: Secondary | ICD-10-CM | POA: Diagnosis not present

## 2023-07-29 DIAGNOSIS — Z0000 Encounter for general adult medical examination without abnormal findings: Secondary | ICD-10-CM | POA: Diagnosis not present

## 2023-07-29 DIAGNOSIS — J301 Allergic rhinitis due to pollen: Secondary | ICD-10-CM | POA: Diagnosis not present

## 2023-07-29 DIAGNOSIS — R7303 Prediabetes: Secondary | ICD-10-CM | POA: Diagnosis not present

## 2023-07-29 DIAGNOSIS — E7800 Pure hypercholesterolemia, unspecified: Secondary | ICD-10-CM | POA: Diagnosis not present

## 2023-07-29 DIAGNOSIS — I10 Essential (primary) hypertension: Secondary | ICD-10-CM | POA: Diagnosis not present

## 2023-08-08 DIAGNOSIS — Z23 Encounter for immunization: Secondary | ICD-10-CM | POA: Diagnosis not present

## 2024-01-26 DIAGNOSIS — J301 Allergic rhinitis due to pollen: Secondary | ICD-10-CM | POA: Diagnosis not present

## 2024-01-26 DIAGNOSIS — E039 Hypothyroidism, unspecified: Secondary | ICD-10-CM | POA: Diagnosis not present

## 2024-01-26 DIAGNOSIS — R002 Palpitations: Secondary | ICD-10-CM | POA: Diagnosis not present

## 2024-01-26 DIAGNOSIS — R7303 Prediabetes: Secondary | ICD-10-CM | POA: Diagnosis not present

## 2024-01-26 DIAGNOSIS — B354 Tinea corporis: Secondary | ICD-10-CM | POA: Diagnosis not present

## 2024-01-26 DIAGNOSIS — I10 Essential (primary) hypertension: Secondary | ICD-10-CM | POA: Diagnosis not present

## 2024-01-26 DIAGNOSIS — E7800 Pure hypercholesterolemia, unspecified: Secondary | ICD-10-CM | POA: Diagnosis not present

## 2024-08-09 DIAGNOSIS — R7303 Prediabetes: Secondary | ICD-10-CM | POA: Diagnosis not present

## 2024-08-09 DIAGNOSIS — J301 Allergic rhinitis due to pollen: Secondary | ICD-10-CM | POA: Diagnosis not present

## 2024-08-09 DIAGNOSIS — Z0000 Encounter for general adult medical examination without abnormal findings: Secondary | ICD-10-CM | POA: Diagnosis not present

## 2024-08-09 DIAGNOSIS — I10 Essential (primary) hypertension: Secondary | ICD-10-CM | POA: Diagnosis not present

## 2024-08-09 DIAGNOSIS — E7800 Pure hypercholesterolemia, unspecified: Secondary | ICD-10-CM | POA: Diagnosis not present

## 2024-08-09 DIAGNOSIS — E039 Hypothyroidism, unspecified: Secondary | ICD-10-CM | POA: Diagnosis not present

## 2024-08-16 DIAGNOSIS — H04123 Dry eye syndrome of bilateral lacrimal glands: Secondary | ICD-10-CM | POA: Diagnosis not present

## 2024-08-16 DIAGNOSIS — H532 Diplopia: Secondary | ICD-10-CM | POA: Diagnosis not present

## 2024-08-16 DIAGNOSIS — H3561 Retinal hemorrhage, right eye: Secondary | ICD-10-CM | POA: Diagnosis not present

## 2024-08-22 DIAGNOSIS — Z23 Encounter for immunization: Secondary | ICD-10-CM | POA: Diagnosis not present

## 2024-09-20 DIAGNOSIS — H524 Presbyopia: Secondary | ICD-10-CM | POA: Diagnosis not present

## 2024-09-20 DIAGNOSIS — H04123 Dry eye syndrome of bilateral lacrimal glands: Secondary | ICD-10-CM | POA: Diagnosis not present

## 2024-09-20 DIAGNOSIS — H3561 Retinal hemorrhage, right eye: Secondary | ICD-10-CM | POA: Diagnosis not present
